# Patient Record
Sex: Female | Born: 1950
Health system: Southern US, Community
[De-identification: ages and names within clinical notes are randomized; demographics above are authoritative.]

## PROBLEM LIST (undated history)

## (undated) DIAGNOSIS — Z803 Family history of malignant neoplasm of breast: Secondary | ICD-10-CM

## (undated) DIAGNOSIS — Z8341 Family history of multiple endocrine neoplasia [MEN] syndrome: Secondary | ICD-10-CM

## (undated) HISTORY — DX: Family history of malignant neoplasm of breast: Z80.3

## (undated) HISTORY — PX: ABDOMINAL HYSTERECTOMY: SHX81

## (undated) HISTORY — DX: Family history of multiple endocrine neoplasia (MEN) syndrome: Z83.41

## (undated) HISTORY — PX: APPENDECTOMY: SHX54

## (undated) HISTORY — PX: TONSILLECTOMY: SUR1361

## (undated) HISTORY — PX: ABDOMINAL SURGERY: SHX537

---

## 1997-11-27 ENCOUNTER — Ambulatory Visit (HOSPITAL_COMMUNITY): Admission: RE | Admit: 1997-11-27 | Discharge: 1997-11-27 | Payer: Self-pay | Admitting: Internal Medicine

## 1999-04-08 ENCOUNTER — Inpatient Hospital Stay (HOSPITAL_COMMUNITY): Admission: RE | Admit: 1999-04-08 | Discharge: 1999-04-11 | Payer: Self-pay | Admitting: Obstetrics and Gynecology

## 1999-04-08 ENCOUNTER — Encounter (INDEPENDENT_AMBULATORY_CARE_PROVIDER_SITE_OTHER): Payer: Self-pay | Admitting: Specialist

## 1999-04-25 ENCOUNTER — Inpatient Hospital Stay (HOSPITAL_COMMUNITY): Admission: AD | Admit: 1999-04-25 | Discharge: 1999-04-25 | Payer: Self-pay | Admitting: Obstetrics and Gynecology

## 2000-05-05 ENCOUNTER — Other Ambulatory Visit: Admission: RE | Admit: 2000-05-05 | Discharge: 2000-05-05 | Payer: Self-pay | Admitting: Obstetrics and Gynecology

## 2000-05-28 ENCOUNTER — Encounter (INDEPENDENT_AMBULATORY_CARE_PROVIDER_SITE_OTHER): Payer: Self-pay | Admitting: *Deleted

## 2000-05-28 LAB — CONVERTED CEMR LAB

## 2000-12-22 ENCOUNTER — Encounter: Admission: RE | Admit: 2000-12-22 | Discharge: 2000-12-22 | Payer: Self-pay | Admitting: Family Medicine

## 2001-01-18 ENCOUNTER — Encounter: Payer: Self-pay | Admitting: Vascular Surgery

## 2001-01-19 ENCOUNTER — Encounter: Admission: RE | Admit: 2001-01-19 | Discharge: 2001-01-19 | Payer: Self-pay | Admitting: Obstetrics and Gynecology

## 2001-01-19 ENCOUNTER — Encounter: Payer: Self-pay | Admitting: Obstetrics and Gynecology

## 2001-01-20 ENCOUNTER — Ambulatory Visit (HOSPITAL_COMMUNITY): Admission: RE | Admit: 2001-01-20 | Discharge: 2001-01-20 | Payer: Self-pay | Admitting: Vascular Surgery

## 2001-01-20 ENCOUNTER — Encounter (INDEPENDENT_AMBULATORY_CARE_PROVIDER_SITE_OTHER): Payer: Self-pay | Admitting: *Deleted

## 2001-02-07 ENCOUNTER — Encounter: Admission: RE | Admit: 2001-02-07 | Discharge: 2001-02-07 | Payer: Self-pay | Admitting: Obstetrics and Gynecology

## 2001-02-07 ENCOUNTER — Encounter: Payer: Self-pay | Admitting: Obstetrics and Gynecology

## 2001-06-22 ENCOUNTER — Other Ambulatory Visit: Admission: RE | Admit: 2001-06-22 | Discharge: 2001-06-22 | Payer: Self-pay | Admitting: Obstetrics and Gynecology

## 2002-02-13 ENCOUNTER — Encounter: Payer: Self-pay | Admitting: Obstetrics and Gynecology

## 2002-02-13 ENCOUNTER — Encounter: Admission: RE | Admit: 2002-02-13 | Discharge: 2002-02-13 | Payer: Self-pay | Admitting: Obstetrics and Gynecology

## 2002-07-19 ENCOUNTER — Other Ambulatory Visit: Admission: RE | Admit: 2002-07-19 | Discharge: 2002-07-19 | Payer: Self-pay | Admitting: Obstetrics and Gynecology

## 2003-02-15 ENCOUNTER — Encounter: Admission: RE | Admit: 2003-02-15 | Discharge: 2003-02-15 | Payer: Self-pay | Admitting: Obstetrics and Gynecology

## 2003-02-15 ENCOUNTER — Encounter: Payer: Self-pay | Admitting: Obstetrics and Gynecology

## 2003-06-19 ENCOUNTER — Ambulatory Visit (HOSPITAL_BASED_OUTPATIENT_CLINIC_OR_DEPARTMENT_OTHER): Admission: RE | Admit: 2003-06-19 | Discharge: 2003-06-19 | Payer: Self-pay | Admitting: Orthopedic Surgery

## 2003-06-19 ENCOUNTER — Ambulatory Visit (HOSPITAL_COMMUNITY): Admission: RE | Admit: 2003-06-19 | Discharge: 2003-06-19 | Payer: Self-pay | Admitting: Orthopedic Surgery

## 2004-03-04 ENCOUNTER — Encounter: Admission: RE | Admit: 2004-03-04 | Discharge: 2004-03-04 | Payer: Self-pay | Admitting: Obstetrics and Gynecology

## 2004-04-04 ENCOUNTER — Ambulatory Visit (HOSPITAL_COMMUNITY): Admission: RE | Admit: 2004-04-04 | Discharge: 2004-04-04 | Payer: Self-pay | Admitting: Gastroenterology

## 2005-03-06 ENCOUNTER — Encounter: Admission: RE | Admit: 2005-03-06 | Discharge: 2005-03-06 | Payer: Self-pay | Admitting: Obstetrics and Gynecology

## 2006-03-08 ENCOUNTER — Encounter: Admission: RE | Admit: 2006-03-08 | Discharge: 2006-03-08 | Payer: Self-pay | Admitting: Obstetrics and Gynecology

## 2006-03-11 ENCOUNTER — Encounter: Admission: RE | Admit: 2006-03-11 | Discharge: 2006-03-11 | Payer: Self-pay | Admitting: Obstetrics and Gynecology

## 2006-06-25 ENCOUNTER — Encounter (INDEPENDENT_AMBULATORY_CARE_PROVIDER_SITE_OTHER): Payer: Self-pay | Admitting: *Deleted

## 2006-09-29 ENCOUNTER — Encounter: Admission: RE | Admit: 2006-09-29 | Discharge: 2006-09-29 | Payer: Self-pay | Admitting: Internal Medicine

## 2006-12-15 ENCOUNTER — Encounter: Admission: RE | Admit: 2006-12-15 | Discharge: 2006-12-15 | Payer: Self-pay | Admitting: Internal Medicine

## 2007-03-11 ENCOUNTER — Encounter: Admission: RE | Admit: 2007-03-11 | Discharge: 2007-03-11 | Payer: Self-pay | Admitting: Obstetrics and Gynecology

## 2007-09-23 ENCOUNTER — Emergency Department (HOSPITAL_COMMUNITY): Admission: EM | Admit: 2007-09-23 | Discharge: 2007-09-23 | Payer: Self-pay | Admitting: Emergency Medicine

## 2007-12-28 ENCOUNTER — Emergency Department (HOSPITAL_COMMUNITY): Admission: EM | Admit: 2007-12-28 | Discharge: 2007-12-28 | Payer: Self-pay | Admitting: Family Medicine

## 2008-03-12 ENCOUNTER — Encounter: Admission: RE | Admit: 2008-03-12 | Discharge: 2008-03-12 | Payer: Self-pay | Admitting: Obstetrics and Gynecology

## 2008-11-23 ENCOUNTER — Emergency Department (HOSPITAL_COMMUNITY): Admission: EM | Admit: 2008-11-23 | Discharge: 2008-11-23 | Payer: Self-pay | Admitting: Emergency Medicine

## 2009-03-13 ENCOUNTER — Encounter: Admission: RE | Admit: 2009-03-13 | Discharge: 2009-03-13 | Payer: Self-pay | Admitting: Obstetrics and Gynecology

## 2010-03-14 ENCOUNTER — Encounter: Admission: RE | Admit: 2010-03-14 | Discharge: 2010-03-14 | Payer: Self-pay | Admitting: Obstetrics and Gynecology

## 2010-08-03 LAB — BASIC METABOLIC PANEL
BUN: 9 mg/dL (ref 6–23)
Calcium: 9.2 mg/dL (ref 8.4–10.5)
Creatinine, Ser: 0.89 mg/dL (ref 0.4–1.2)
GFR calc Af Amer: >60 mL/min (ref >60)

## 2010-08-03 LAB — CBC
MCHC: 34.5 g/dL (ref 30.0–36.0)
Platelets: 232 10*3/uL (ref 150–400)
RBC: 4.47 MIL/uL (ref 3.87–5.11)
WBC: 8.8 10*3/uL (ref 4.0–10.5)

## 2010-08-03 LAB — DIFFERENTIAL
Basophils Relative: 1 % (ref 0–1)
Lymphs Abs: 1.7 10*3/uL (ref 0.7–4.0)
Monocytes Relative: 4 % (ref 3–12)
Neutro Abs: 6.6 10*3/uL (ref 1.7–7.7)
Neutrophils Relative %: 75 % (ref 43–77)

## 2010-09-12 NOTE — Op Note (Signed)
Lori Ruiz, Lori Ruiz               ACCOUNT NO.:  [id number]   MEDICAL RECORD NO.:  [id number]          PATIENT TYPE:  AMB   LOCATION:  ENDO                         FACILITY:  [facility]   PHYSICIAN:  Anselmo Rod, M.D.  DATE OF BIRTH:  [birth year]   DATE OF PROCEDURE:  04/04/2004  DATE OF DISCHARGE:                                 OPERATIVE REPORT   PROCEDURE:  Screening colonoscopy.   ENDOSCOPIST:  Anselmo Rod, M.D.   INSTRUMENT USED:  Olympus video colonoscope.   INDICATIONS FOR PROCEDURE:  A 60 year old white female with a history of  occasional constipation, undergoing screening colonoscopy to rule out  colonic polyps, masses, etc.   PREPROCEDURE PREPARATION:  Informed consent was procured from the patient.  The patient was fasted for eight hours prior to the procedure and prepped  with a bottle of magnesium citrate and a gallon of GoLYTELY the night prior  to the procedure.   PREPROCEDURE PHYSICAL EXAMINATION:  VITAL SIGNS:  Stable.  NECK:  Supple.  CHEST:  Clear to auscultation.  S1 and S2 regular.  ABDOMEN:  Soft with normal bowel sounds.   DESCRIPTION OF PROCEDURE:  The patient was placed in the left lateral  decubitus position and sedated with 60 mg of Demerol and 8 mg of Versed in  slow incremental doses.  Once the patient was adequately sedated and  maintained on low flow oxygen and continuous cardiac monitoring, the Olympus  video colonoscope was advanced from the rectum to the cecum.  The  appendiceal orifice and ileocecal valve were clearly visualized and  photographed.  No masses, polyps, erosions, ulcerations, etc., were seen.  A  small isolated diverticulum was seen in the proximal right colon.  No other  abnormalities were identified.  Retroflexion in the rectum revealed no  abnormalities as well.  The patient tolerated the procedure well without  immediate complications.   IMPRESSION:  Small diverticulum seen in the proximal right colon, otherwise  normal colonoscopy up to the cecum.   RECOMMENDATIONS:  1.  Continue a high fiber diet with liberal fluid intake.  2.  Repeat CRC screening in the next 10 years unless the patient develops      any abnormal symptoms in the interim.  3.  Outpatient follow-up as need arises in the future.      Jyot   JNM/MEDQ  D:  04/04/2004  T:  04/05/2004  Job:  [id number]   cc:   Loraine Leriche A. Waynard Edwards, M.D.  [address]  [place]  [place] [address]  Fax: [phone]

## 2010-09-12 NOTE — Op Note (Signed)
[facility]  Patient:    Lori Ruiz, DIVER Visit Number: [id number] MRN: [id number]          Service Type: DSU Location: [facility] 2899 19 Attending Physician:  Bennye Alm Dictated by:   Di Kindle Edilia Bo, M.D. Proc. 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  OPERATION PERFORMED:  Segmental excision of painful varicose veins of bilateral lower extremities.  SURGEON:  Di Kindle. Edilia Bo, M.D.  ASSISTANTTollie Pizza. Thomasena Edis, P.A.  ANESTHESIA:  General.  DESCRIPTION OF PROCEDURE:  The patient was taken to the operating room after the patient had the varicose veins of both lower extremities marked with her standing.  The legs were then prepped and draped in the usual sterile fashion after the patient has received a general anesthetic.  Through six small 5 mm incisions along the lateral aspect of the right leg and thigh, the veins were teased out and bluntly excised using a mosquito.  Pressure was held for hemostasis.  These incisions were closed with a 4-0 Vicryl suture.  Half-inch Steri-Strips were applied and then a pressure dressing was applied.  On the left side I used four 5 mm longitudinal incisions over the marked varicosities and then using a stab avulsion technique, the veins were teased into the wound and then bluntly avulsed and pressure held for hemostasis.  These incisions were closed with interrupted 4-0 Vicryls.  Half-inch Steri-Strips were applied and a sterile dressing was applied.  The patient tolerated the procedure well and was transferred to the recovery room in satisfactory condition.  All needle and sponge counts were correct. Dictated by:   Di Kindle Edilia Bo, M.D. Attending  Physician:  Bennye Alm DD:  01/20/01 TD:  01/20/01 Job: [id number]

## 2010-09-12 NOTE — Op Note (Signed)
Lori Ruiz, Lori Ruiz                         ACCOUNT NO.:  [id number]   MEDICAL RECORD NO.:  [id number]                   PATIENT TYPE:  AMB   LOCATION:  DSC                                  FACILITY:  [facility]   PHYSICIAN:  Leonides Grills, M.D.                  DATE OF BIRTH:  [birth year]   DATE OF PROCEDURE:  06/19/2003  DATE OF DISCHARGE:                                 OPERATIVE REPORT   PREOPERATIVE DIAGNOSIS:  Bilateral hallux valgus.   POSTOPERATIVE DIAGNOSIS:  Bilateral hallux valgus.   OPERATION PERFORMED:  1. Bilateral chevron bunionectomies.  2. Stress x-rays bilateral feet.   SURGEON:  Leonides Grills, M.D.   ASSISTANT:  Lianne Cure, P.A.   ANESTHESIA:  General endotracheal tube.   ESTIMATED BLOOD LOSS:  Minimal.   TOURNIQUET TIME:  Approximately half hour per side.   COMPLICATIONS:  None.   DISPOSITION:  Stable to PR.   INDICATIONS FOR PROCEDURE:  The patient is a 60 year old very pleasant  female who presents today with bilateral painful hallux, valgus deformities  that were resistant to conservative management.  The patient  has consented  for the above procedure.  All risks which include infection, neurovascular  injury, nonunion, malunion, hardware irritation, hardware failure,  persistent pain, worsening pain, stiffness, arthritis and possible  recurrence of hallux varus were all explained, questions were encouraged and  answered.   DESCRIPTION OF PROCEDURE:  The patient was brought to the operating room and  placed in supine position after adequate general endotracheal tube  anesthesia was administered as well as Ancef 1 g IV piggyback.  Bilateral  lower extremities were prepped and draped in sterile manner over a  proximally placed thigh tourniquet.  We started at the left side.  The limb  was gravity exsanguinated and the tourniquet was elevated to 290 mmHg.  A  longitudinal incision over the medial aspect of the midline over the great  toe MTP joint  was then made.  Dissection was then carried down through skin  and neurovascular structures were identified and protected both superiorly  and inferiorly.  L-shaped capsulotomy was then made.  Simple bunionectomy  was then performed with sagittal saw.  Lateral capsule was then released  from within the joint.  The center of the head was then identified.  Chevron  osteotomy was then created with the sagittal saw protecting soft tissues  both superiorly and inferiorly.  The head was then translated approximately  3 mm laterally.  This was then fixed with a 2.5 mm fully threaded cortical  screw using a 1.5 mm drill hole respectively.  This was countersunk as well.  This had excellent fixation.  Redundant bone medially was then trimmed off  with a sagittal saw, Rocky Link Johnson's ridge was rounded off with a rongeur.  The joint and wound was copiously irrigated with normal saline.  Capsule was  then advanced superiorly and proximally  with the toe held in reduced  position fixing it with 2-0 Vicryl suture.  At this point stress x-rays were  obtained and showed excellent placement of fixation as well as correction  and location of the sesamoids with range of motion as well.  The same exact  procedure was performed once the tourniquet was deflated on the left side  and hemostasis was obtained on the left.  The same exact procedure was  performed on the right side as well.  At the end of the procedure, the  wounds were then copiously irrigated with normal saline.  The wounds were  closed with 4-0 nylon suture.  Sterile dressing was applied.  Roger Mann  dressings were applied.  Hard sole shoes were applied.  The patient was  stable to the PR.                                               Leonides Grills, M.D.    PB/MEDQ  D:  06/19/2003  T:  06/19/2003  Job:  [id number]

## 2010-09-12 NOTE — Op Note (Signed)
[facility]  Patient:    Lori Ruiz                       MRN: [id number] Proc. Date: 04/08/99 Adm. Date:  [id number] Attending:  Trevor Iha                           Operative Report  PREOPERATIVE DIAGNOSIS:       Generous stress urinary incontinence and left-sided                               pelvic pain.  POSTOPERATIVE DIAGNOSIS:      Generous stress urinary incontinence and left-sided                               pelvic pain.  OPERATION:                    TAH/BSO and Birch retropubic urethropexy.  SURGEON:                      Trevor Iha, M.D.  ASSISTANT:                    Duke Salvia. Marcelle Overlie, M.D.  ANESTHESIA:                   General endotracheal.  INDICATIONS:                  The patient is a 60 year old gravida 4, para 3, abortion 1, with continued stress urinary incontinence.  No improvement with conservative measures including ________ exercises, hormone replacement therapy  (socially debilitating) and presents for definitive surgical correction of this. She furthermore has a history of left-sided ectopic pregnancy and scar tissue, nd has left-sided discomfort which requires nonsteroidal anti-inflammatory medications.  She desires hysterectomy because of the persistent left-sided pain. Risks and benefits were discussed at length including to but not limited to risks of infection, bleeding, damage to gallbladder, ureters, inability to alleviate,  urinary incontinence, or over correction with prolonged catheter wear.  The patient gives her informed consent.  See History and Physical for further details.  FINDINGS AT TIME OF SURGERY:  Left-sided adhesive disease, normal appearing ovaries and uterus.  The appendix is retrocecal and not visualized.  DESCRIPTION OF PROCEDURE:     After adequate anesthesia, the patient was placed in the supine position in a frog-leg position.  She was sterilely prepped  and draped. A 30 cc Foley catheter was placed transurethrally.  After sterile prep and drape, a Pfannenstiel skin incision was made two fingerbreadths above the pubic symphysis, and taken down sharply in the midline, and incised transversely, then superiorly and inferiorly at the bellies at the rectus muscles which were separated sharply in the midline.  The peritoneum was entered sharply.  An OConnor-OSullivan retractor was placed.  The bowel was packed cephalad.  Lysis of adhesions from the bowel,  omentum and pelvic side wall to the left tube and ovary were sharply dissected ith Metzenbaum scissors.  Kelly clamps were placed across the utero-ovarian ligaments bilaterally.  The round ligaments were identified and suture ligated with  0 Monocryl.  The broad ligament was opened bilaterally.  The bladder was dissected off the anterior cervix.  A small  window at the broad ligament was made and a Heaney clamp was placed across the infundibulopelvic ligament.  Care was taken o avoid the ureter and place it close to the ovary.  This was done bilaterally. he clamp was cut and tied, and double tied with 0 Monocryl suture.  The uterovesical and cervix were then skeletonized and Heaney clamps were placed across the uterine vesicle which showed the level of the internal os.  The bladder was dissected off the anterior cervix and straight Heaney clamps were placed across the cardinal ligament bilaterally, and the clamp cut and tied down to the uterosacral ligaments, where curved Heaney clamps were placed across the uterosacral ligaments bilaterally, and clamped, cut and tied again with 0 Monocryl.                                At this time, the vagina was entered.  The uterus was incised and completely removed with the cervix intact using the Satinsky scissors. The angles of the vagina were closed with 0 Monocryl angled sutures with good approximation.  The vagina was closed in a  horizontal fashion with figure-of-eights of 0 Monocryl with good approximation and good hemostasis.  Irrigation was applied at this time.  Hemostasis was noted to be adequate.  The uterosacral ligaments ere then plicated in the midline.  A small amount of peritoneal bleeders were cauterized with Bovie cautery after hemostasis was achieved.  The packing was removed.  The retractor was then removed and began with the [facility] retropubic urethropexy.                                The OConnor-OSullivan retractor was used to reflect the rectus muscles laterally.  The obturator was left and a hand was placed in he vagina.  The Foley catheter was pulled towards the operators hand.  The angle of the bladder neck was noted with the operators fingertips.  A suture of 0 Ethibond was placed 1 cm laterally and 1 cm inferiorly to the right and left angle of the bladder neck.  I place a pulley suture, and the bladder neck was then elevated o the pubic symphysis where the suture was now placed through the Coopers ligament, directly above, but with good support noted.  A second suture was placed 1 cm lateral to that and approximately 2 cm from the ___ angle.  This was done bilaterally with a pulley stitch through the muscularis layer, being careful not to enter the vagina and placed through Coopers ligament.  This was done bilaterally with good support noted.                                At this time, the sutures were tied with good support of the UV angle noted.  The operators hand was introduced from the vagina.  The  bladder was filled with sterile milk, approximately 400 cc.  Examination of the  areas around the suture showed no leaking of sterile milk.  At this time, Bonnano suprapubic catheter was placed with direct visualization with entering into the  bladder.  This was sutured together to the skin.  The peritoneum had been previously closed with 0 Monocryl suture.  The fascia was  then closed in a 0 Vicryl running fashion.  Irrigation was again  applied after adequate hemostasis.  The kin was stapled and Steri-Strips applied.                                The patient tolerated the procedure well and stable  on transfer to the recovery room.  Sponge and instrument count was correct x 3.  The estimated blood loss for the procedure was 150 cc. DD:  04/08/99 TD:  04/09/99 Job: [id number]

## 2011-02-03 ENCOUNTER — Other Ambulatory Visit: Payer: Self-pay | Admitting: Obstetrics and Gynecology

## 2011-02-03 DIAGNOSIS — Z1231 Encounter for screening mammogram for malignant neoplasm of breast: Secondary | ICD-10-CM

## 2011-03-16 ENCOUNTER — Ambulatory Visit
Admission: RE | Admit: 2011-03-16 | Discharge: 2011-03-16 | Disposition: A | Discharge status: Home / Self Care | Payer: 59 | Source: Ambulatory Visit | Attending: Obstetrics and Gynecology | Admitting: Obstetrics and Gynecology

## 2011-03-16 DIAGNOSIS — Z1231 Encounter for screening mammogram for malignant neoplasm of breast: Secondary | ICD-10-CM

## 2011-12-16 ENCOUNTER — Other Ambulatory Visit: Payer: Self-pay | Admitting: Obstetrics and Gynecology

## 2011-12-16 DIAGNOSIS — N63 Unspecified lump in unspecified breast: Secondary | ICD-10-CM

## 2011-12-16 DIAGNOSIS — Z1231 Encounter for screening mammogram for malignant neoplasm of breast: Secondary | ICD-10-CM

## 2012-03-01 ENCOUNTER — Other Ambulatory Visit: Payer: Self-pay | Admitting: Obstetrics and Gynecology

## 2012-03-01 DIAGNOSIS — N63 Unspecified lump in unspecified breast: Secondary | ICD-10-CM

## 2012-03-16 ENCOUNTER — Ambulatory Visit
Admission: RE | Admit: 2012-03-16 | Discharge: 2012-03-16 | Disposition: A | Discharge status: Home / Self Care | Payer: 59 | Source: Ambulatory Visit | Attending: Obstetrics and Gynecology | Admitting: Obstetrics and Gynecology

## 2012-03-16 ENCOUNTER — Ambulatory Visit: Payer: 59

## 2012-03-16 DIAGNOSIS — N63 Unspecified lump in unspecified breast: Secondary | ICD-10-CM

## 2013-02-14 ENCOUNTER — Other Ambulatory Visit: Payer: Self-pay

## 2013-02-14 DIAGNOSIS — Z1231 Encounter for screening mammogram for malignant neoplasm of breast: Secondary | ICD-10-CM

## 2013-03-17 ENCOUNTER — Ambulatory Visit
Admission: RE | Admit: 2013-03-17 | Discharge: 2013-03-17 | Disposition: A | Discharge status: Home / Self Care | Payer: 59 | Source: Ambulatory Visit

## 2013-03-17 DIAGNOSIS — Z1231 Encounter for screening mammogram for malignant neoplasm of breast: Secondary | ICD-10-CM

## 2013-03-21 ENCOUNTER — Other Ambulatory Visit: Payer: Self-pay | Admitting: Obstetrics and Gynecology

## 2013-03-21 DIAGNOSIS — R928 Other abnormal and inconclusive findings on diagnostic imaging of breast: Secondary | ICD-10-CM

## 2013-03-28 ENCOUNTER — Ambulatory Visit
Admission: RE | Admit: 2013-03-28 | Discharge: 2013-03-28 | Disposition: A | Discharge status: Home / Self Care | Payer: 59 | Source: Ambulatory Visit | Attending: Obstetrics and Gynecology | Admitting: Obstetrics and Gynecology

## 2013-03-28 DIAGNOSIS — R928 Other abnormal and inconclusive findings on diagnostic imaging of breast: Secondary | ICD-10-CM

## 2013-06-27 ENCOUNTER — Ambulatory Visit: Payer: 59 | Attending: Orthopedic Surgery

## 2013-06-27 DIAGNOSIS — M25559 Pain in unspecified hip: Secondary | ICD-10-CM | POA: Insufficient documentation

## 2013-06-27 DIAGNOSIS — M25659 Stiffness of unspecified hip, not elsewhere classified: Secondary | ICD-10-CM | POA: Insufficient documentation

## 2013-06-27 DIAGNOSIS — IMO0001 Reserved for inherently not codable concepts without codable children: Secondary | ICD-10-CM | POA: Insufficient documentation

## 2013-06-27 DIAGNOSIS — R5381 Other malaise: Secondary | ICD-10-CM | POA: Insufficient documentation

## 2013-07-06 ENCOUNTER — Ambulatory Visit: Payer: 59 | Admitting: Rehabilitation

## 2013-07-10 ENCOUNTER — Ambulatory Visit: Payer: 59 | Admitting: Rehabilitation

## 2013-07-12 ENCOUNTER — Encounter: Payer: 59 | Admitting: Physical Therapy

## 2013-07-17 ENCOUNTER — Encounter: Payer: 59 | Admitting: Physical Therapy

## 2013-07-19 ENCOUNTER — Encounter: Payer: 59 | Admitting: Physical Therapy

## 2013-08-18 ENCOUNTER — Other Ambulatory Visit: Payer: Self-pay | Admitting: Occupational Medicine

## 2013-08-18 ENCOUNTER — Ambulatory Visit: Payer: Self-pay

## 2013-08-18 DIAGNOSIS — R52 Pain, unspecified: Secondary | ICD-10-CM

## 2014-02-20 ENCOUNTER — Other Ambulatory Visit: Payer: Self-pay

## 2014-02-20 DIAGNOSIS — Z1231 Encounter for screening mammogram for malignant neoplasm of breast: Secondary | ICD-10-CM

## 2014-03-19 ENCOUNTER — Ambulatory Visit
Admission: RE | Admit: 2014-03-19 | Discharge: 2014-03-19 | Disposition: A | Discharge status: Home / Self Care | Payer: 59 | Source: Ambulatory Visit

## 2014-03-19 DIAGNOSIS — Z1231 Encounter for screening mammogram for malignant neoplasm of breast: Secondary | ICD-10-CM

## 2015-02-11 ENCOUNTER — Other Ambulatory Visit: Payer: Self-pay

## 2015-02-11 DIAGNOSIS — Z1231 Encounter for screening mammogram for malignant neoplasm of breast: Secondary | ICD-10-CM

## 2015-03-25 ENCOUNTER — Ambulatory Visit
Admission: RE | Admit: 2015-03-25 | Discharge: 2015-03-25 | Disposition: A | Discharge status: Home / Self Care | Payer: 59 | Source: Ambulatory Visit

## 2015-03-25 DIAGNOSIS — Z1231 Encounter for screening mammogram for malignant neoplasm of breast: Secondary | ICD-10-CM

## 2015-03-26 ENCOUNTER — Other Ambulatory Visit: Payer: Self-pay | Admitting: Obstetrics and Gynecology

## 2015-03-26 DIAGNOSIS — R928 Other abnormal and inconclusive findings on diagnostic imaging of breast: Secondary | ICD-10-CM

## 2015-03-27 ENCOUNTER — Ambulatory Visit
Admission: RE | Admit: 2015-03-27 | Discharge: 2015-03-27 | Disposition: A | Discharge status: Home / Self Care | Payer: 59 | Source: Ambulatory Visit | Attending: Obstetrics and Gynecology | Admitting: Obstetrics and Gynecology

## 2015-03-27 DIAGNOSIS — R928 Other abnormal and inconclusive findings on diagnostic imaging of breast: Secondary | ICD-10-CM

## 2015-05-15 ENCOUNTER — Emergency Department (HOSPITAL_COMMUNITY): Payer: 59

## 2015-05-15 ENCOUNTER — Encounter (HOSPITAL_COMMUNITY): Payer: Self-pay | Admitting: *Deleted

## 2015-05-15 ENCOUNTER — Emergency Department (HOSPITAL_COMMUNITY)
Admission: EM | Admit: 2015-05-15 | Discharge: 2015-05-15 | Disposition: A | Discharge status: Other Acute Inpatient Hospital | Payer: 59 | Source: Home / Self Care | Attending: Family Medicine | Admitting: Family Medicine

## 2015-05-15 DIAGNOSIS — Z79899 Other long term (current) drug therapy: Secondary | ICD-10-CM | POA: Diagnosis not present

## 2015-05-15 DIAGNOSIS — Z7982 Long term (current) use of aspirin: Secondary | ICD-10-CM | POA: Diagnosis not present

## 2015-05-15 DIAGNOSIS — R51 Headache: Secondary | ICD-10-CM | POA: Diagnosis not present

## 2015-05-15 DIAGNOSIS — R42 Dizziness and giddiness: Secondary | ICD-10-CM | POA: Insufficient documentation

## 2015-05-15 DIAGNOSIS — H5702 Anisocoria: Secondary | ICD-10-CM | POA: Diagnosis not present

## 2015-05-15 DIAGNOSIS — R29818 Other symptoms and signs involving the nervous system: Secondary | ICD-10-CM | POA: Diagnosis not present

## 2015-05-15 NOTE — ED Notes (Addendum)
Spoke with Dr. Roxanne Mins regarding needing a head CT  CT notified

## 2015-05-15 NOTE — ED Provider Notes (Addendum)
CSN: [id number]     Arrival date & time 05/15/15  1923 History   First MD Initiated Contact with Patient 05/15/15 2006     Chief Complaint  Patient presents with  . Dizziness   (Consider location/radiation/quality/duration/timing/severity/associated sxs/prior Treatment) Patient is a 65 y.o. female presenting with dizziness. The history is provided by the patient and the spouse.  Dizziness Quality:  Room spinning Severity:  Moderate Onset quality:  Sudden Duration:  18 hours Progression:  Improving Chronicity:  New Context comment:  Onset awoke from sleep. Relieved by:  Being still Associated symptoms: headaches   Associated symptoms: no chest pain and no palpitations     History reviewed. No pertinent past medical history. Past Surgical History  Procedure Laterality Date  . Abdominal hysterectomy    . Tonsillectomy    . Appendectomy    . Abdominal surgery     History reviewed. No pertinent family history. Social History  Substance Use Topics  . Smoking status: Never Smoker   . Smokeless tobacco: None  . Alcohol Use: No   OB History    No data available     Review of Systems  Eyes: Negative.   Cardiovascular: Negative for chest pain, palpitations and leg swelling.  Neurological: Positive for dizziness and headaches.  All other systems reviewed and are negative.   Allergies  Review of patient's allergies indicates no known allergies.  Home Medications   Prior to Admission medications   Medication Sig Start Date End Date Taking? Authorizing Provider  DICLOFENAC PO Take by mouth.   Yes Historical Provider, MD  ESTRADIOL PO Take by mouth.   Yes Historical Provider, MD  Ipratropium Bromide (ATROVENT IN) Inhale into the lungs.   Yes Historical Provider, MD   Meds Ordered and Administered this Visit  Medications - No data to display  BP 167/98 mmHg  Pulse 73  Temp(Src) 97.9 F (36.6 C) (Oral)  SpO2 97% No data found.   Physical Exam  Constitutional: She  is oriented to person, place, and time. She appears well-developed and well-nourished.  Eyes: Conjunctivae and EOM are normal. Right eye exhibits normal extraocular motion. Left eye exhibits normal extraocular motion and no nystagmus. Pupils are unequal.    Neck: Normal range of motion. Neck supple.  Cardiovascular: Normal heart sounds and intact distal pulses.   Pulmonary/Chest: Effort normal and breath sounds normal.  Musculoskeletal: Normal range of motion.  Neurological: She is alert and oriented to person, place, and time. A cranial nerve deficit is present. Coordination normal.  Skin: Skin is warm and dry.  Nursing note and vitals reviewed.   ED Course  Procedures (including critical care time)  Labs Review Labs Reviewed - No data to display  Imaging Review No results found.   Visual Acuity Review  Right Eye Distance:   Left Eye Distance:   Bilateral Distance:    Right Eye Near:   Left Eye Near:    Bilateral Near:         MDM   1. Acute focal neurological deficit    Sent for neuro eval of dizziness, hbp, l>r pupils, headache sudden onset around 1am today.    Billy Fischer, MD 05/15/15 2028  Billy Fischer, MD 05/15/15 2029

## 2015-05-15 NOTE — ED Notes (Signed)
Became   Dizzy    Last  Night          Headache           Vertigo         l    Eye  Is  Dilated           Contacts    Are        Still  In    Hit     r  Side  Head        4  Days    Did  Not  Black  Out  She  Is   Awake   And  Alert       At  This  Time              Hand  Grips  Are

## 2015-05-15 NOTE — ED Notes (Signed)
Patient presents stating she got up this morning with a headache and felt dizzy.  She took 2 Ibuprofen at that time and noticed her left pupil was dilated  Stated she hit herself on the right side of her head last Saturday with a piece of wood

## 2015-05-16 ENCOUNTER — Emergency Department (HOSPITAL_COMMUNITY)
Admission: EM | Admit: 2015-05-16 | Discharge: 2015-05-16 | Disposition: A | Discharge status: Home / Self Care | Payer: 59 | Attending: Emergency Medicine | Admitting: Emergency Medicine

## 2015-05-16 DIAGNOSIS — H5702 Anisocoria: Secondary | ICD-10-CM

## 2015-05-16 DIAGNOSIS — R42 Dizziness and giddiness: Secondary | ICD-10-CM

## 2015-05-16 NOTE — ED Notes (Signed)
Pt left with all her belongings and ambulated out of the treatment area.  
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
## 2015-05-16 NOTE — Discharge Instructions (Signed)
Vertigo Vertigo means that you feel like you are moving when you are not. Vertigo can also make you feel like things around you are moving when they are not. This feeling can come and go at any time. Vertigo often goes away on its own. HOME CARE  Avoid making fast movements.  Avoid driving.  Avoid using heavy machinery.  Avoid doing any task or activity that might cause danger to you or other people if you would have a vertigo attack while you are doing it.  Sit down right away if you feel dizzy or have trouble with your balance.  Take over-the-counter and prescription medicines only as told by your doctor.  Follow instructions from your doctor about which positions or movements you should avoid.  Drink enough fluid to keep your pee (urine) clear or pale yellow.  Keep all follow-up visits as told by your doctor. This is important. GET HELP IF:  Medicine does not help your vertigo.  You have a fever.  Your problems get worse or you have new symptoms.  Your family or friends see changes in your behavior.  You feel sick to your stomach (nauseous) or you throw up (vomit).  You have a "pins and needles" feeling or you are numb in part of your body. GET HELP RIGHT AWAY IF:  You have trouble moving or talking.  You are always dizzy.  You pass out (faint).  You get very bad headaches.  You feel weak or have trouble using your hands, arms, or legs.  You have changes in your hearing.  You have changes in your seeing (vision).  You get a stiff neck.  Bright light starts to bother you.   This information is not intended to replace advice given to you by your health care provider. Make sure you discuss any questions you have with your health care provider.   Document Released: 01/21/2008 Document Revised: 01/02/2015 Document Reviewed: 08/06/2014 Elsevier Interactive Patient Education 2016 [employer]  

## 2015-05-28 MED FILL — DICLOFENAC SODIUM 1% GEL: 1 | 17 days supply | Qty: 100 | Fill #3

## 2015-07-08 ENCOUNTER — Telehealth: Payer: 59 | Admitting: Family

## 2015-07-08 DIAGNOSIS — R059 Cough, unspecified: Secondary | ICD-10-CM

## 2015-07-08 DIAGNOSIS — R05 Cough: Secondary | ICD-10-CM

## 2015-07-08 MED ORDER — BENZONATATE 100 MG PO CAPS: 100.0000 mg | ORAL_CAPSULE | Freq: Three times a day (TID) | ORAL | Status: AC | PRN

## 2015-07-08 NOTE — Progress Notes (Signed)
We are sorry that you are not feeling well.  Here is how we plan to help!  Based on what you have shared with me it looks like you have upper respiratory tract inflammation that has resulted in a significant cough.  Inflammation and infection in the upper respiratory tract is commonly called bronchitis and has four common causes:  Allergies, Viral Infections, Acid Reflux and Bacterial Infections.  Allergies, viruses and acid reflux are treated by controlling symptoms or eliminating the cause. An example might be a cough caused by taking certain blood pressure medications. You stop the cough by changing the medication. Another example might be a cough caused by acid reflux. Controlling the reflux helps control the cough.  Based on your presentation I believe you most likely have A cough due to a virus.  This is called viral bronchitis and is best treated by rest, plenty of fluids and control of the cough.  You may use Ibuprofen or Tylenol as directed to help your symptoms.    In addition you may use A non-prescription cough medication called Mucinex DM: take 2 tablets every 12 hours. and A prescription cough medication called Tessalon Perles 100mg. You may take 1-2 capsules every 8 hours as needed for your cough.    HOME CARE . Only take medications as instructed by your medical team. . Complete the entire course of an antibiotic. . Drink plenty of fluids and get plenty of rest. . Avoid close contacts especially the very young and the elderly . Cover your mouth if you cough or cough into your sleeve. . Always remember to wash your hands . A steam or ultrasonic humidifier can help congestion.    GET HELP RIGHT AWAY IF: . You develop worsening fever. . You become short of breath . You cough up blood. . Your symptoms persist after you have completed your treatment plan MAKE SURE YOU   Understand these instructions.  Will watch your condition.  Will get help right away if you are not doing  well or get worse.  Your e-visit answers were reviewed by a board certified advanced clinical practitioner to complete your personal care plan.  Depending on the condition, your plan could have included both over the counter or prescription medications. If there is a problem please reply  once you have received a response from your provider. Your safety is important to us.  If you have drug allergies check your prescription carefully.    You can use MyChart to ask questions about today's visit, request a non-urgent call back, or ask for a work or school excuse for 24 hours related to this e-Visit. If it has been greater than 24 hours you will need to follow up with your provider, or enter a new e-Visit to address those concerns. You will get an e-mail in the next two days asking about your experience.  I hope that your e-visit has been valuable and will speed your recovery. Thank you for using e-visits.   

## 2015-07-22 MED FILL — DICLOFENAC SOD EC 50 MG TAB: 50 | 30 days supply | Qty: 60 | Fill #1

## 2015-07-22 MED FILL — ATORVASTATIN 10 MG TABLET: 10 | 30 days supply | Qty: 30 | Fill #0

## 2015-08-19 MED FILL — ESTRADIOL 1 MG TABLET: 1 | 30 days supply | Qty: 30 | Fill #0

## 2015-09-03 MED FILL — CYCLOBENZAPRINE 5 MG TABLET: 5 | 20 days supply | Qty: 60 | Fill #0

## 2015-09-18 MED FILL — ATORVASTATIN 10 MG TABLET: 10 | 30 days supply | Qty: 30 | Fill #1

## 2015-09-18 MED FILL — ESTRADIOL 1 MG TABLET: 1 | 30 days supply | Qty: 30 | Fill #1

## 2015-10-04 MED FILL — TRIAMCINOLONE 0.1% CREAM: 0.1 | 26 days supply | Qty: 80 | Fill #0

## 2015-10-22 MED FILL — ESTRADIOL 1 MG TABLET: 1 | 90 days supply | Qty: 90 | Fill #2

## 2015-10-23 MED FILL — ATORVASTATIN 10 MG TABLET: 10 | 30 days supply | Qty: 30 | Fill #2

## 2015-12-27 MED FILL — ATORVASTATIN 10 MG TABLET: 10 | 90 days supply | Qty: 90 | Fill #3

## 2015-12-31 MED FILL — DICLOFENAC SOD EC 50 MG TAB: 50 | 30 days supply | Qty: 60 | Fill #2

## 2016-01-27 MED FILL — ESTRADIOL 1 MG TABLET: 1 | 90 days supply | Qty: 90 | Fill #3

## 2016-01-27 MED FILL — valACYclovir HCL 1 GM TABS: 1 | 15 days supply | Qty: 30 | Fill #0

## 2016-02-12 DIAGNOSIS — K130 Diseases of lips: Secondary | ICD-10-CM | POA: Diagnosis not present

## 2016-02-12 DIAGNOSIS — L570 Actinic keratosis: Secondary | ICD-10-CM | POA: Diagnosis not present

## 2016-02-12 DIAGNOSIS — D224 Melanocytic nevi of scalp and neck: Secondary | ICD-10-CM | POA: Diagnosis not present

## 2016-02-12 MED FILL — FLUOROURACIL 5% CREAM: 5 | 14 days supply | Qty: 40 | Fill #0

## 2016-02-12 MED FILL — HYDROCORTISONE 2.5% CREAM: 2.5 | 14 days supply | Qty: 30 | Fill #0

## 2016-02-18 ENCOUNTER — Other Ambulatory Visit: Payer: Self-pay | Admitting: Obstetrics and Gynecology

## 2016-02-18 DIAGNOSIS — Z1231 Encounter for screening mammogram for malignant neoplasm of breast: Secondary | ICD-10-CM

## 2016-03-25 ENCOUNTER — Ambulatory Visit
Admission: RE | Admit: 2016-03-25 | Discharge: 2016-03-25 | Disposition: A | Discharge status: Home / Self Care | Payer: 59 | Source: Ambulatory Visit | Attending: Obstetrics and Gynecology | Admitting: Obstetrics and Gynecology

## 2016-03-25 ENCOUNTER — Other Ambulatory Visit: Payer: Self-pay | Admitting: Obstetrics and Gynecology

## 2016-03-25 DIAGNOSIS — Z1231 Encounter for screening mammogram for malignant neoplasm of breast: Secondary | ICD-10-CM

## 2016-03-27 MED FILL — ATORVASTATIN 10 MG TABLET: 10 | 90 days supply | Qty: 90 | Fill #4

## 2016-03-27 MED FILL — DICLOFENAC SOD EC 50 MG TAB: 50 | 30 days supply | Qty: 60 | Fill #3

## 2016-03-31 DIAGNOSIS — M859 Disorder of bone density and structure, unspecified: Secondary | ICD-10-CM | POA: Diagnosis not present

## 2016-03-31 DIAGNOSIS — R358 Other polyuria: Secondary | ICD-10-CM | POA: Diagnosis not present

## 2016-03-31 DIAGNOSIS — R8299 Other abnormal findings in urine: Secondary | ICD-10-CM | POA: Diagnosis not present

## 2016-03-31 DIAGNOSIS — Z0000 Encounter for general adult medical examination without abnormal findings: Secondary | ICD-10-CM | POA: Diagnosis not present

## 2016-04-08 DIAGNOSIS — Z01419 Encounter for gynecological examination (general) (routine) without abnormal findings: Secondary | ICD-10-CM | POA: Diagnosis not present

## 2016-04-08 DIAGNOSIS — Z6826 Body mass index (BMI) 26.0-26.9, adult: Secondary | ICD-10-CM | POA: Diagnosis not present

## 2016-04-09 DIAGNOSIS — B009 Herpesviral infection, unspecified: Secondary | ICD-10-CM | POA: Diagnosis not present

## 2016-04-09 DIAGNOSIS — B351 Tinea unguium: Secondary | ICD-10-CM | POA: Diagnosis not present

## 2016-04-09 DIAGNOSIS — Z1389 Encounter for screening for other disorder: Secondary | ICD-10-CM | POA: Diagnosis not present

## 2016-04-09 DIAGNOSIS — L218 Other seborrheic dermatitis: Secondary | ICD-10-CM | POA: Diagnosis not present

## 2016-04-09 DIAGNOSIS — E784 Other hyperlipidemia: Secondary | ICD-10-CM | POA: Diagnosis not present

## 2016-04-09 DIAGNOSIS — R51 Headache: Secondary | ICD-10-CM | POA: Diagnosis not present

## 2016-04-09 DIAGNOSIS — M859 Disorder of bone density and structure, unspecified: Secondary | ICD-10-CM | POA: Diagnosis not present

## 2016-04-09 DIAGNOSIS — M25552 Pain in left hip: Secondary | ICD-10-CM | POA: Diagnosis not present

## 2016-04-09 DIAGNOSIS — Z832 Family history of diseases of the blood and blood-forming organs and certain disorders involving the immune mechanism: Secondary | ICD-10-CM | POA: Diagnosis not present

## 2016-04-09 DIAGNOSIS — Z0000 Encounter for general adult medical examination without abnormal findings: Secondary | ICD-10-CM | POA: Diagnosis not present

## 2016-04-09 DIAGNOSIS — H9313 Tinnitus, bilateral: Secondary | ICD-10-CM | POA: Diagnosis not present

## 2016-04-14 DIAGNOSIS — Z1212 Encounter for screening for malignant neoplasm of rectum: Secondary | ICD-10-CM | POA: Diagnosis not present

## 2016-04-17 ENCOUNTER — Encounter: Payer: Self-pay | Admitting: Hematology and Oncology

## 2016-04-17 ENCOUNTER — Telehealth: Payer: Self-pay | Admitting: Hematology and Oncology

## 2016-04-17 NOTE — Telephone Encounter (Signed)
Appt scheduled w/Gudena on 1/18 at 1pm. Pt aware to arrive 30 minutes early. Demographics verified Letter mailed to the pt.

## 2016-04-30 DIAGNOSIS — H5203 Hypermetropia, bilateral: Secondary | ICD-10-CM | POA: Diagnosis not present

## 2016-04-30 DIAGNOSIS — H524 Presbyopia: Secondary | ICD-10-CM | POA: Diagnosis not present

## 2016-05-13 ENCOUNTER — Telehealth: Payer: Self-pay | Admitting: Hematology and Oncology

## 2016-05-13 NOTE — Telephone Encounter (Signed)
Patient called to cancel her appointment tomorrow 1/18 due to bad weather.  She need to re schedule for one day next week call back is [phone]

## 2016-05-14 ENCOUNTER — Encounter: Payer: Self-pay | Admitting: Hematology and Oncology

## 2016-05-20 ENCOUNTER — Encounter: Payer: Self-pay | Admitting: Hematology and Oncology

## 2016-05-20 ENCOUNTER — Ambulatory Visit: Payer: Self-pay | Admitting: Hematology and Oncology

## 2016-05-20 ENCOUNTER — Ambulatory Visit (HOSPITAL_BASED_OUTPATIENT_CLINIC_OR_DEPARTMENT_OTHER): Payer: 59 | Admitting: Hematology and Oncology

## 2016-05-20 DIAGNOSIS — D6852 Prothrombin gene mutation: Secondary | ICD-10-CM | POA: Diagnosis not present

## 2016-05-20 NOTE — Assessment & Plan Note (Signed)
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 Workup done by Dr. Joylene Draft revealed heterozygous prothrombin gene mutation.  Counseling: I discussed with her that thrombin gene mutation heterozygous can be present in 1% of [place] population and it increases the risk of blood clots 3-4 times normal. It increases the risk of venous thrombosis. The risk of arterial thrombosis is fairly minimal. The risk of recurrent blood clots is also not found to be markedly elevated on someone has an existing blood clot. She does not have factor V Leiden. It appears that her son may have factor V Leiden. I explained the coagulation pathway and how prothrombin gene mutation leads to increased risk of thrombosis.  Recommendations: 1. Patient needs to stop estrogen replacement therapy. 2. I discussed with her that she needs to avoid situations where the risk of blood clot would increase including during travel, surgeries etc. She is a prior smoker and I discussed with her that she cannot resume cigarette smoking. 3. Recommended that she exercise daily and stay physically active. There is no role of anticoagulation without the existence of blood clots.  Return to clinic on an as-needed basis.

## 2016-05-20 NOTE — Progress Notes (Signed)
[place] CONSULT NOTE  Patient Care Team: Crist Infante, MD as PCP - General (Internal Medicine)  CHIEF COMPLAINTS/PURPOSE OF CONSULTATION:  Prothrombin gene mutation  HISTORY OF PRESENTING ILLNESS:  Lori Ruiz 66 y.o. female is here because of recent diagnosis of prothrombin gene mutation. Patient has a family history of MEN-2 syndrome. Her daughter had a stroke and her son had pulmonary embolism. Her mother had extensive DVTs. Because of her family history she had extensive blood work by Dr. Joylene Draft which revealed that she was heterozygous for prothrombin gene mutation [id number]. She was sent was for discussion regarding the risk of blood clots as well as any role of preventive medications. Patient works as a Chartered loss adjuster at [employer] and stays very busy.  MEDICAL HISTORY:  Hyperlipidemia  SURGICAL HISTORY: Past Surgical History:  Procedure Laterality Date  . ABDOMINAL HYSTERECTOMY    . ABDOMINAL SURGERY    . APPENDECTOMY    . TONSILLECTOMY      SOCIAL HISTORY:Prior smoker quit in 1982 15-pack-year smoking, denies any alcohol use. Denies any drug abuse. FAMILY HISTORY: Family history of blood clots as mentioned above, MEN2 syndrome in her sister  ALLERGIES:  has No Known Allergies.  MEDICATIONS:  Current Outpatient Prescriptions  Medication Sig Dispense Refill  . aspirin 81 MG chewable tablet Chew 81 mg by mouth every other day.     Marland Kitchen atorvastatin (LIPITOR) 10 MG tablet   10  . benzonatate (TESSALON PERLES) 100 MG capsule Take 1-2 capsules (100-200 mg total) by mouth every 8 (eight) hours as needed for cough. 30 capsule 0  . Calcium-Vitamin D-Vitamin K (VIACTIV PO) Take 1 tablet by mouth daily.    . diclofenac (VOLTAREN) 50 MG EC tablet Take 50 mg by mouth 2 (two) times daily as needed for moderate pain (finger joint pain).   4  . diclofenac sodium (VOLTAREN) 1 % GEL Apply 2 g topically 4 (four) times daily as needed (for finger joint pain).   3   . estradiol (ESTRACE) 1 MG tablet   11  . valACYclovir (VALTREX) 500 MG tablet Take 500-1,000 mg by mouth daily as needed (for fever blisters, take 2 tablets on day 1, take 1 tablet daily for 4 days then stop).    Marland Kitchen VITAMIN E PO Take 1 capsule by mouth daily.     No current facility-administered medications for this visit.     REVIEW OF SYSTEMS:   Constitutional: Denies fevers, chills or abnormal night sweats Eyes: Denies blurriness of vision, double vision or watery eyes Ears, nose, mouth, throat, and face: Denies mucositis or sore throat Respiratory: Denies cough, dyspnea or wheezes Cardiovascular: Denies palpitation, chest discomfort or lower extremity swelling Gastrointestinal:  Denies nausea, heartburn or change in bowel habits Skin: Denies abnormal skin rashes Lymphatics: Denies new lymphadenopathy or easy bruising Neurological:Denies numbness, tingling or new weaknesses Behavioral/Psych: Mood is stable, no new changes  All other systems were reviewed with the patient and are negative.  PHYSICAL EXAMINATION: ECOG PERFORMANCE STATUS: 1 - Symptomatic but completely ambulatory  Vitals:   05/20/16 0937  BP: 120/69  Pulse: 75  Resp: 18  Temp: 97.5 F (36.4 C)   Filed Weights   05/20/16 0937  Weight: 151 lb 3.2 oz (68.6 kg)    GENERAL:alert, no distress and comfortable SKIN: skin color, texture, turgor are normal, no rashes or significant lesions EYES: normal, conjunctiva are pink and non-injected, sclera clear OROPHARYNX:no exudate, no erythema and lips, buccal mucosa, and tongue normal  NECK: supple, thyroid normal size, non-tender, without nodularity LYMPH:  no palpable lymphadenopathy in the cervical, axillary or inguinal LUNGS: clear to auscultation and percussion with normal breathing effort HEART: regular rate & rhythm and no murmurs and no lower extremity edema ABDOMEN:abdomen soft, non-tender and normal bowel sounds Musculoskeletal:no cyanosis of digits and no  clubbing  PSYCH: alert & oriented x 3 with fluent speech NEURO: no focal motor/sensory deficits  LABORATORY DATA:  I have reviewed the data as listed Lab Results  Component Value Date   WBC 8.8 11/23/2008   HGB 13.9 11/23/2008   HCT 40.1 11/23/2008   MCV 89.8 11/23/2008   PLT 232 11/23/2008   Lab Results  Component Value Date   NA 138 11/23/2008   K 3.8 11/23/2008   CL 105 11/23/2008   CO2 27 11/23/2008    RADIOGRAPHIC STUDIES: I have personally reviewed the radiological reports and agreed with the findings in the report.  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 Workup done by Dr. Joylene Draft revealed heterozygous prothrombin gene mutation.  Counseling: I discussed with her that thrombin gene mutation heterozygous can be present in 1% of [place] population and it increases the risk of blood clots 3-4 times normal. It increases the risk of venous thrombosis. The risk of arterial thrombosis is fairly minimal. The risk of recurrent blood clots is also not found to be markedly elevated on someone has an existing blood clot. She does not have factor V Leiden. It appears that her son may have factor V Leiden. I explained the coagulation pathway and how prothrombin gene mutation leads to increased risk of thrombosis.  Recommendations: 1. Patient needs to stop estrogen replacement therapy. 2. I discussed with her that she needs to avoid situations where the risk of blood clot would increase including during travel, surgeries etc. She is a prior smoker and I discussed with her that she cannot resume cigarette smoking. 3. Recommended that she exercise daily and stay physically active. There is no role of anticoagulation without the existence of blood clots.  Return to clinic on an as-needed  basis.   All questions were answered. The patient knows to call the clinic with any problems, questions or concerns.    Rulon Eisenmenger, MD 05/20/16

## 2016-05-25 ENCOUNTER — Ambulatory Visit: Payer: Self-pay | Admitting: Hematology and Oncology

## 2016-06-08 MED FILL — ATORVASTATIN 10 MG TABLET: 10 | 90 days supply | Qty: 90 | Fill #5

## 2016-06-24 MED FILL — HYDROCORTISONE 2.5% CREAM: 2.5 | 14 days supply | Qty: 30 | Fill #1

## 2016-06-24 MED FILL — valACYclovir HCL 1 GM TABS: 1 | 15 days supply | Qty: 30 | Fill #1

## 2016-09-09 MED FILL — ATORVASTATIN 10 MG TABLET: 10 | 90 days supply | Qty: 90 | Fill #0

## 2016-10-08 MED FILL — valACYclovir HCL 1 GM TABS: 1 | 15 days supply | Qty: 30 | Fill #2

## 2016-10-21 MED FILL — TRIAMCINOLONE 0.1% CREAM: 0.1 | 30 days supply | Qty: 80 | Fill #0

## 2016-12-03 MED FILL — ATORVASTATIN 10 MG TABLET: 10 | 90 days supply | Qty: 90 | Fill #1

## 2016-12-03 MED FILL — HYDROCORTISONE 2.5% CREAM: 2.5 | 14 days supply | Qty: 30 | Fill #0

## 2017-01-28 DIAGNOSIS — R194 Change in bowel habit: Secondary | ICD-10-CM | POA: Diagnosis not present

## 2017-01-28 DIAGNOSIS — R159 Full incontinence of feces: Secondary | ICD-10-CM | POA: Diagnosis not present

## 2017-01-28 DIAGNOSIS — K5904 Chronic idiopathic constipation: Secondary | ICD-10-CM | POA: Diagnosis not present

## 2017-02-19 ENCOUNTER — Other Ambulatory Visit: Payer: Self-pay | Admitting: Obstetrics and Gynecology

## 2017-02-19 DIAGNOSIS — Z1231 Encounter for screening mammogram for malignant neoplasm of breast: Secondary | ICD-10-CM

## 2017-03-09 MED FILL — ATORVASTATIN 10 MG TABLET: 10 | 90 days supply | Qty: 90 | Fill #2

## 2017-03-26 ENCOUNTER — Ambulatory Visit
Admission: RE | Admit: 2017-03-26 | Discharge: 2017-03-26 | Disposition: A | Discharge status: Home / Self Care | Payer: 59 | Source: Ambulatory Visit | Attending: Obstetrics and Gynecology | Admitting: Obstetrics and Gynecology

## 2017-03-26 DIAGNOSIS — Z1231 Encounter for screening mammogram for malignant neoplasm of breast: Secondary | ICD-10-CM

## 2017-04-13 DIAGNOSIS — Z01419 Encounter for gynecological examination (general) (routine) without abnormal findings: Secondary | ICD-10-CM | POA: Diagnosis not present

## 2017-04-13 DIAGNOSIS — Z6825 Body mass index (BMI) 25.0-25.9, adult: Secondary | ICD-10-CM | POA: Diagnosis not present

## 2017-04-30 MED FILL — LINZESS 290 MCG CAPSULE: 290 | 90 days supply | Qty: 90 | Fill #0

## 2017-05-03 DIAGNOSIS — H5203 Hypermetropia, bilateral: Secondary | ICD-10-CM | POA: Diagnosis not present

## 2017-05-03 DIAGNOSIS — H2513 Age-related nuclear cataract, bilateral: Secondary | ICD-10-CM | POA: Diagnosis not present

## 2017-05-03 DIAGNOSIS — H524 Presbyopia: Secondary | ICD-10-CM | POA: Diagnosis not present

## 2017-05-27 DIAGNOSIS — N390 Urinary tract infection, site not specified: Secondary | ICD-10-CM | POA: Diagnosis not present

## 2017-05-27 DIAGNOSIS — Z0000 Encounter for general adult medical examination without abnormal findings: Secondary | ICD-10-CM | POA: Diagnosis not present

## 2017-05-27 DIAGNOSIS — M859 Disorder of bone density and structure, unspecified: Secondary | ICD-10-CM | POA: Diagnosis not present

## 2017-06-03 DIAGNOSIS — Z8341 Family history of multiple endocrine neoplasia [MEN] syndrome: Secondary | ICD-10-CM | POA: Diagnosis not present

## 2017-06-03 DIAGNOSIS — R3121 Asymptomatic microscopic hematuria: Secondary | ICD-10-CM | POA: Diagnosis not present

## 2017-06-03 DIAGNOSIS — L218 Other seborrheic dermatitis: Secondary | ICD-10-CM | POA: Diagnosis not present

## 2017-06-03 DIAGNOSIS — D6852 Prothrombin gene mutation: Secondary | ICD-10-CM | POA: Diagnosis not present

## 2017-06-03 DIAGNOSIS — Z0000 Encounter for general adult medical examination without abnormal findings: Secondary | ICD-10-CM | POA: Diagnosis not present

## 2017-06-03 DIAGNOSIS — R82998 Other abnormal findings in urine: Secondary | ICD-10-CM | POA: Diagnosis not present

## 2017-06-03 DIAGNOSIS — R5383 Other fatigue: Secondary | ICD-10-CM | POA: Diagnosis not present

## 2017-06-03 DIAGNOSIS — B351 Tinea unguium: Secondary | ICD-10-CM | POA: Diagnosis not present

## 2017-06-03 DIAGNOSIS — N390 Urinary tract infection, site not specified: Secondary | ICD-10-CM | POA: Diagnosis not present

## 2017-06-03 DIAGNOSIS — Z1389 Encounter for screening for other disorder: Secondary | ICD-10-CM | POA: Diagnosis not present

## 2017-06-03 DIAGNOSIS — Z832 Family history of diseases of the blood and blood-forming organs and certain disorders involving the immune mechanism: Secondary | ICD-10-CM | POA: Diagnosis not present

## 2017-06-03 DIAGNOSIS — H9313 Tinnitus, bilateral: Secondary | ICD-10-CM | POA: Diagnosis not present

## 2017-06-03 MED FILL — ATORVASTATIN 10 MG TABLET: 10 | 90 days supply | Qty: 90 | Fill #0

## 2017-06-04 MED FILL — CEFDINIR 300 MG CAPSULE: 300 | 5 days supply | Qty: 10 | Fill #0

## 2017-06-10 DIAGNOSIS — Z1212 Encounter for screening for malignant neoplasm of rectum: Secondary | ICD-10-CM | POA: Diagnosis not present

## 2017-06-10 DIAGNOSIS — M859 Disorder of bone density and structure, unspecified: Secondary | ICD-10-CM | POA: Diagnosis not present

## 2017-07-08 MED FILL — SHINGRIX 50 MCG SUS: 50 | 1 days supply | Qty: 1 | Fill #0

## 2017-08-26 MED FILL — ATORVASTATIN 10 MG TABLET: 10 | 90 days supply | Qty: 90 | Fill #1

## 2017-10-07 MED FILL — SHINGRIX 50 MCG SUS: 50 | 1 days supply | Qty: 1 | Fill #1

## 2017-10-29 MED FILL — TRIAMCINOLONE ACETONIDE 0.1: 0.1 | 20 days supply | Qty: 60 | Fill #0

## 2017-11-29 MED FILL — ATORVASTATIN 10 MG TABLET: 10 | 90 days supply | Qty: 90 | Fill #2

## 2018-02-10 ENCOUNTER — Other Ambulatory Visit: Payer: Self-pay | Admitting: Obstetrics and Gynecology

## 2018-02-10 DIAGNOSIS — Z1231 Encounter for screening mammogram for malignant neoplasm of breast: Secondary | ICD-10-CM

## 2018-03-02 DIAGNOSIS — L720 Epidermal cyst: Secondary | ICD-10-CM | POA: Diagnosis not present

## 2018-03-02 DIAGNOSIS — D2262 Melanocytic nevi of left upper limb, including shoulder: Secondary | ICD-10-CM | POA: Diagnosis not present

## 2018-03-02 DIAGNOSIS — D485 Neoplasm of uncertain behavior of skin: Secondary | ICD-10-CM | POA: Diagnosis not present

## 2018-03-02 DIAGNOSIS — D2261 Melanocytic nevi of right upper limb, including shoulder: Secondary | ICD-10-CM | POA: Diagnosis not present

## 2018-03-02 DIAGNOSIS — D1801 Hemangioma of skin and subcutaneous tissue: Secondary | ICD-10-CM | POA: Diagnosis not present

## 2018-03-02 DIAGNOSIS — D225 Melanocytic nevi of trunk: Secondary | ICD-10-CM | POA: Diagnosis not present

## 2018-03-02 DIAGNOSIS — L821 Other seborrheic keratosis: Secondary | ICD-10-CM | POA: Diagnosis not present

## 2018-03-02 MED FILL — ATORVASTATIN 10 MG TABLET: 10 | 90 days supply | Qty: 90 | Fill #3

## 2018-03-15 DIAGNOSIS — D485 Neoplasm of uncertain behavior of skin: Secondary | ICD-10-CM | POA: Diagnosis not present

## 2018-03-15 DIAGNOSIS — L988 Other specified disorders of the skin and subcutaneous tissue: Secondary | ICD-10-CM | POA: Diagnosis not present

## 2018-03-28 ENCOUNTER — Ambulatory Visit
Admission: RE | Admit: 2018-03-28 | Discharge: 2018-03-28 | Disposition: A | Discharge status: Home / Self Care | Payer: 59 | Source: Ambulatory Visit | Attending: Obstetrics and Gynecology | Admitting: Obstetrics and Gynecology

## 2018-03-28 DIAGNOSIS — Z1231 Encounter for screening mammogram for malignant neoplasm of breast: Secondary | ICD-10-CM

## 2018-04-15 DIAGNOSIS — Z01419 Encounter for gynecological examination (general) (routine) without abnormal findings: Secondary | ICD-10-CM | POA: Diagnosis not present

## 2018-04-15 DIAGNOSIS — Z6825 Body mass index (BMI) 25.0-25.9, adult: Secondary | ICD-10-CM | POA: Diagnosis not present

## 2018-05-04 MED FILL — MYRBETRIQ ER 25 MG TABLET: 25 | 30 days supply | Qty: 30 | Fill #0

## 2018-05-09 DIAGNOSIS — H5203 Hypermetropia, bilateral: Secondary | ICD-10-CM | POA: Diagnosis not present

## 2018-05-09 DIAGNOSIS — H524 Presbyopia: Secondary | ICD-10-CM | POA: Diagnosis not present

## 2018-05-09 DIAGNOSIS — H2513 Age-related nuclear cataract, bilateral: Secondary | ICD-10-CM | POA: Diagnosis not present

## 2018-05-25 MED FILL — ATORVASTATIN 10 MG TABLET: 10 | 90 days supply | Qty: 90 | Fill #0

## 2018-06-27 DIAGNOSIS — R82998 Other abnormal findings in urine: Secondary | ICD-10-CM | POA: Diagnosis not present

## 2018-06-27 DIAGNOSIS — Z0000 Encounter for general adult medical examination without abnormal findings: Secondary | ICD-10-CM | POA: Diagnosis not present

## 2018-06-27 DIAGNOSIS — M859 Disorder of bone density and structure, unspecified: Secondary | ICD-10-CM | POA: Diagnosis not present

## 2018-07-01 DIAGNOSIS — Z0000 Encounter for general adult medical examination without abnormal findings: Secondary | ICD-10-CM | POA: Diagnosis not present

## 2018-07-01 DIAGNOSIS — R3121 Asymptomatic microscopic hematuria: Secondary | ICD-10-CM | POA: Diagnosis not present

## 2018-07-01 DIAGNOSIS — R5383 Other fatigue: Secondary | ICD-10-CM | POA: Diagnosis not present

## 2018-07-01 DIAGNOSIS — H9313 Tinnitus, bilateral: Secondary | ICD-10-CM | POA: Diagnosis not present

## 2018-07-01 DIAGNOSIS — L218 Other seborrheic dermatitis: Secondary | ICD-10-CM | POA: Diagnosis not present

## 2018-07-01 DIAGNOSIS — Z832 Family history of diseases of the blood and blood-forming organs and certain disorders involving the immune mechanism: Secondary | ICD-10-CM | POA: Diagnosis not present

## 2018-07-01 DIAGNOSIS — Z8341 Family history of multiple endocrine neoplasia [MEN] syndrome: Secondary | ICD-10-CM | POA: Diagnosis not present

## 2018-07-01 DIAGNOSIS — D6852 Prothrombin gene mutation: Secondary | ICD-10-CM | POA: Diagnosis not present

## 2018-07-01 DIAGNOSIS — M859 Disorder of bone density and structure, unspecified: Secondary | ICD-10-CM | POA: Diagnosis not present

## 2018-07-01 DIAGNOSIS — N183 Chronic kidney disease, stage 3 (moderate): Secondary | ICD-10-CM | POA: Diagnosis not present

## 2018-07-01 MED FILL — valACYclovir HCL 1 GM TABS: 1 | 5 days supply | Qty: 20 | Fill #0

## 2018-07-05 DIAGNOSIS — Z1212 Encounter for screening for malignant neoplasm of rectum: Secondary | ICD-10-CM | POA: Diagnosis not present

## 2018-08-08 MED FILL — ATORVASTATIN 10 MG TABLET: 10 | 90 days supply | Qty: 90 | Fill #0

## 2018-10-10 MED FILL — TRIAMCINOLONE 0.1% CREAM: 0.1 | 30 days supply | Qty: 60 | Fill #0

## 2018-11-11 MED FILL — MYRBETRIQ ER 25 MG TABLET: 25 | 30 days supply | Qty: 30 | Fill #1

## 2018-11-11 MED FILL — TRIAMCINOLONE 0.1% CREAM: 0.1 | 30 days supply | Qty: 60 | Fill #1

## 2018-11-11 MED FILL — ATORVASTATIN 10 MG TABLET: 10 | 90 days supply | Qty: 90 | Fill #0

## 2019-01-11 MED FILL — valACYclovir HCL 1 GM TABS: 1 | 5 days supply | Qty: 20 | Fill #1

## 2019-02-03 MED FILL — NYSTATIN 100,000 UNITS/ML S: 100000 | 13 days supply | Qty: 300 | Fill #0

## 2019-02-13 ENCOUNTER — Other Ambulatory Visit: Payer: Self-pay | Admitting: Obstetrics and Gynecology

## 2019-02-13 DIAGNOSIS — Z1231 Encounter for screening mammogram for malignant neoplasm of breast: Secondary | ICD-10-CM

## 2019-02-20 MED FILL — ATORVASTATIN 10 MG TABLET: 10 | 90 days supply | Qty: 90 | Fill #1

## 2019-04-04 ENCOUNTER — Ambulatory Visit
Admission: RE | Admit: 2019-04-04 | Discharge: 2019-04-04 | Disposition: A | Discharge status: Home / Self Care | Payer: 59 | Source: Ambulatory Visit | Attending: Obstetrics and Gynecology | Admitting: Obstetrics and Gynecology

## 2019-04-04 ENCOUNTER — Other Ambulatory Visit: Payer: Self-pay

## 2019-04-04 DIAGNOSIS — Z1231 Encounter for screening mammogram for malignant neoplasm of breast: Secondary | ICD-10-CM

## 2019-04-11 MED FILL — MYRBETRIQ ER 25 MG TABLET: 25 | 30 days supply | Qty: 30 | Fill #2

## 2019-04-18 DIAGNOSIS — Z6825 Body mass index (BMI) 25.0-25.9, adult: Secondary | ICD-10-CM | POA: Diagnosis not present

## 2019-04-18 DIAGNOSIS — Z01419 Encounter for gynecological examination (general) (routine) without abnormal findings: Secondary | ICD-10-CM | POA: Diagnosis not present

## 2019-04-18 MED FILL — NITROFURANTOIN MONO-MCR 100: 100 | 7 days supply | Qty: 14 | Fill #0

## 2019-04-27 DIAGNOSIS — L720 Epidermal cyst: Secondary | ICD-10-CM | POA: Diagnosis not present

## 2019-04-27 DIAGNOSIS — D2371 Other benign neoplasm of skin of right lower limb, including hip: Secondary | ICD-10-CM | POA: Diagnosis not present

## 2019-04-27 DIAGNOSIS — D1801 Hemangioma of skin and subcutaneous tissue: Secondary | ICD-10-CM | POA: Diagnosis not present

## 2019-04-27 DIAGNOSIS — D225 Melanocytic nevi of trunk: Secondary | ICD-10-CM | POA: Diagnosis not present

## 2019-04-27 DIAGNOSIS — L821 Other seborrheic keratosis: Secondary | ICD-10-CM | POA: Diagnosis not present

## 2019-04-27 DIAGNOSIS — D2272 Melanocytic nevi of left lower limb, including hip: Secondary | ICD-10-CM | POA: Diagnosis not present

## 2019-05-11 DIAGNOSIS — H5203 Hypermetropia, bilateral: Secondary | ICD-10-CM | POA: Diagnosis not present

## 2019-05-11 DIAGNOSIS — H524 Presbyopia: Secondary | ICD-10-CM | POA: Diagnosis not present

## 2019-05-16 MED FILL — ATORVASTATIN 10 MG TABLET: 10 | 90 days supply | Qty: 90 | Fill #0

## 2019-06-01 MED FILL — valACYclovir HCL 1 GM TABS: 1 | 5 days supply | Qty: 20 | Fill #2

## 2019-06-14 MED FILL — NYSTATIN-TRIAMCINOLONE OINT: 100000-0.1 | 20 days supply | Qty: 15 | Fill #0

## 2019-07-27 DIAGNOSIS — E7849 Other hyperlipidemia: Secondary | ICD-10-CM | POA: Diagnosis not present

## 2019-07-27 DIAGNOSIS — M8589 Other specified disorders of bone density and structure, multiple sites: Secondary | ICD-10-CM | POA: Diagnosis not present

## 2019-07-27 DIAGNOSIS — Z0000 Encounter for general adult medical examination without abnormal findings: Secondary | ICD-10-CM | POA: Diagnosis not present

## 2019-07-27 DIAGNOSIS — M859 Disorder of bone density and structure, unspecified: Secondary | ICD-10-CM | POA: Diagnosis not present

## 2019-07-28 MED FILL — ATORVASTATIN 10 MG TABLET: 10 | 90 days supply | Qty: 90 | Fill #1

## 2019-07-31 IMAGING — MG DIGITAL SCREENING BILATERAL MAMMOGRAM WITH TOMO AND CAD
8 series · 8 of 24 positions shown · non-contrast
Comparison: Previous exam(s).

CLINICAL DATA: Screening.

EXAM:
DIGITAL SCREENING BILATERAL MAMMOGRAM WITH TOMO AND CAD

[L MLO synth-2D]
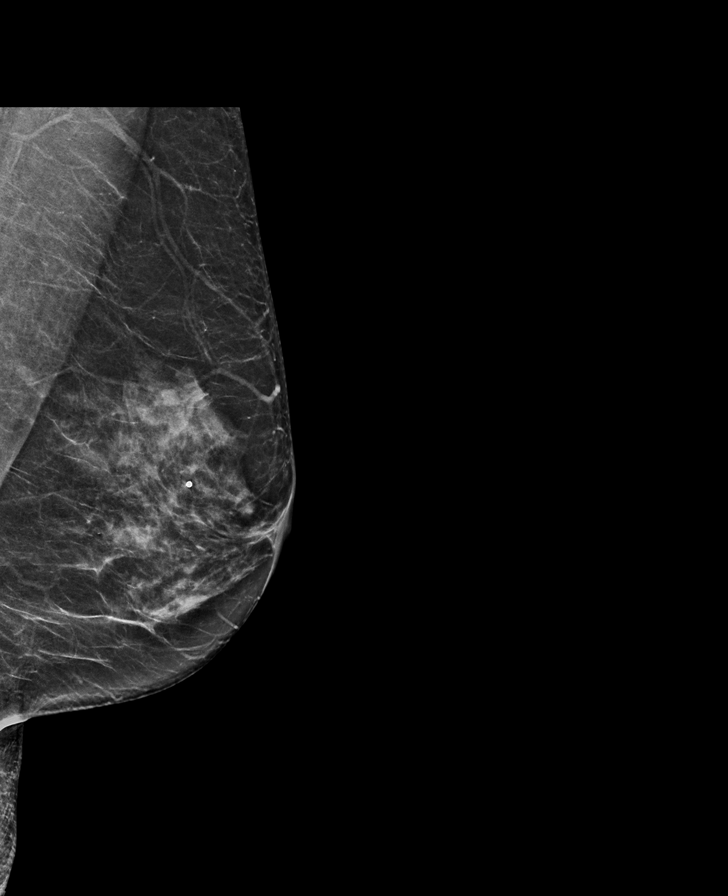

[R CC synth-2D]
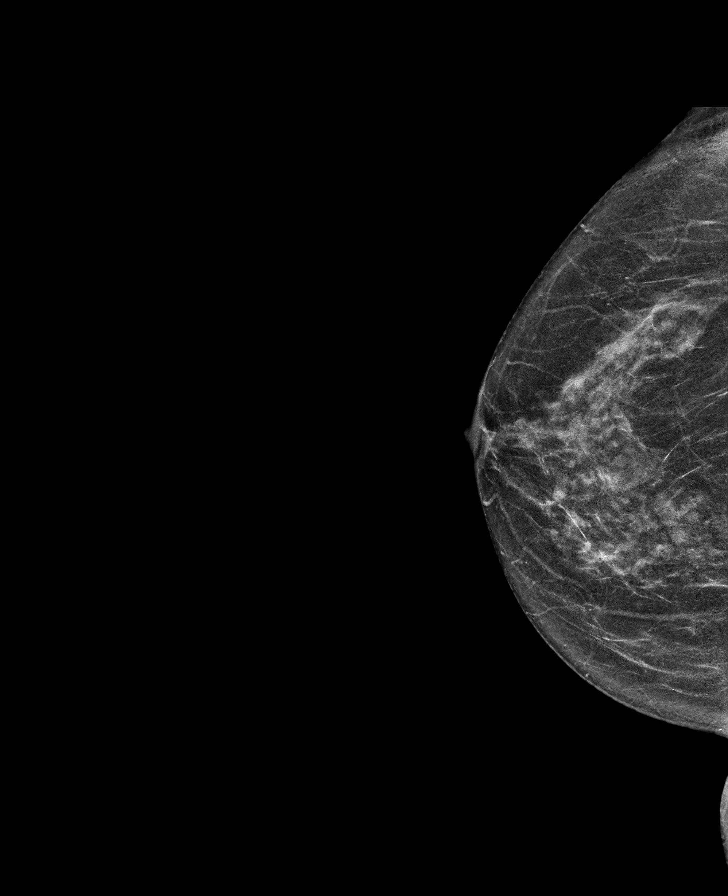

[R MLO synth-2D]
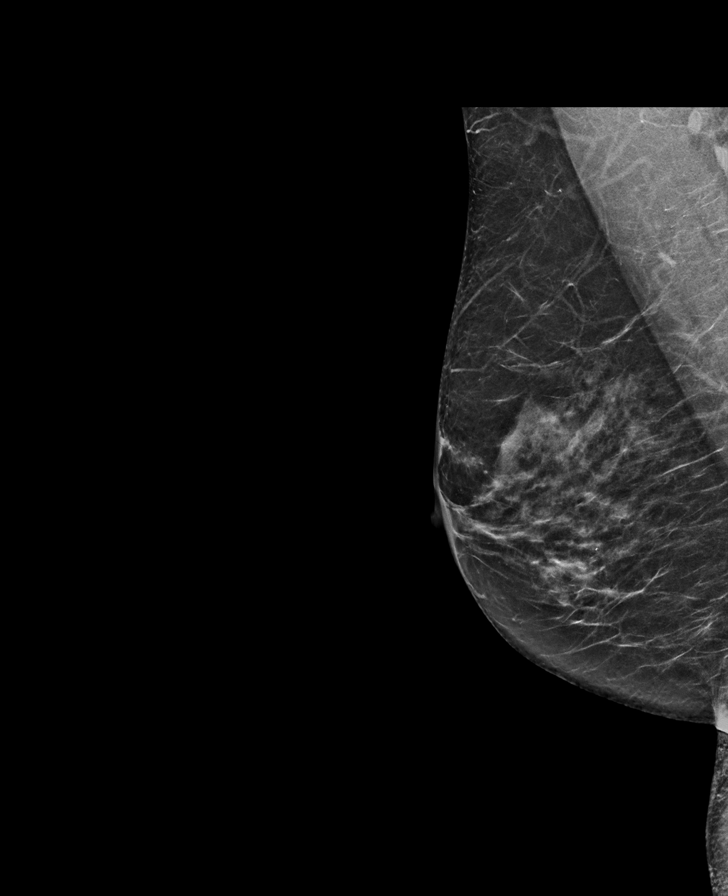

[L CC synth-2D]
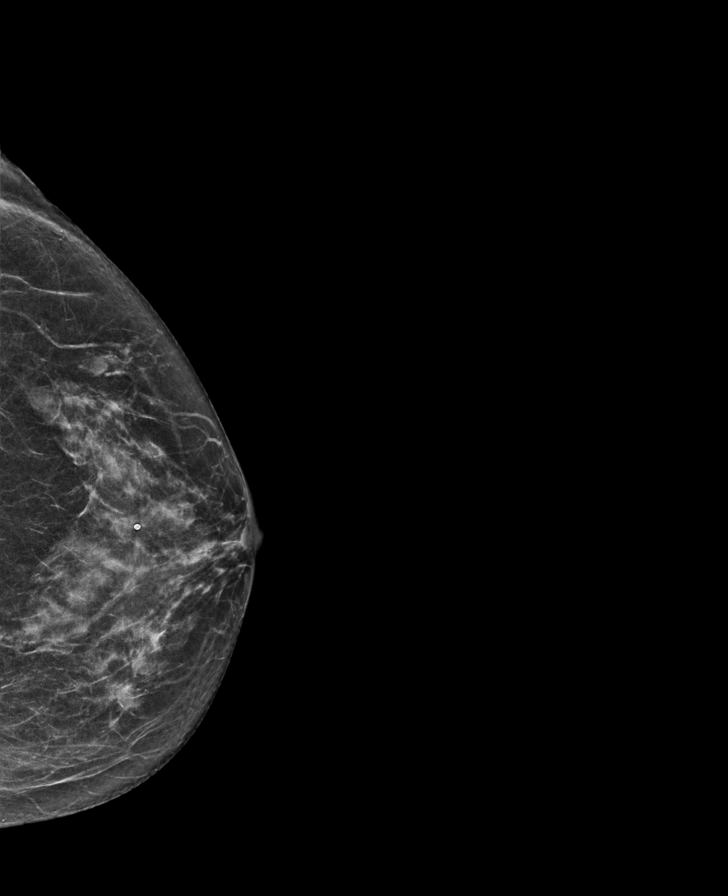

[L CC tomo · tomo slice 30/59.0]
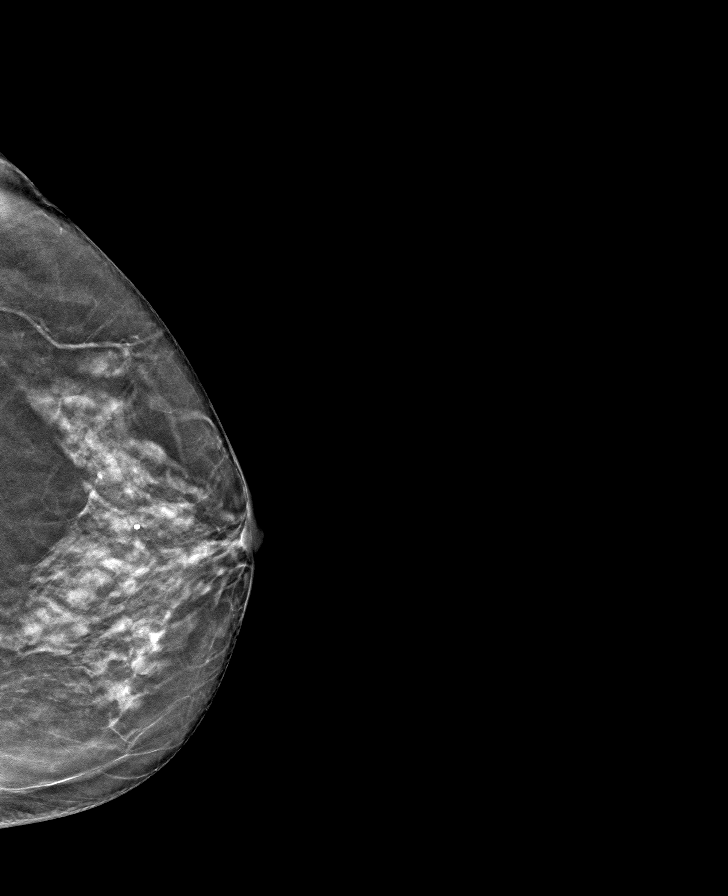

[L MLO tomo · tomo slice 34/67.0]
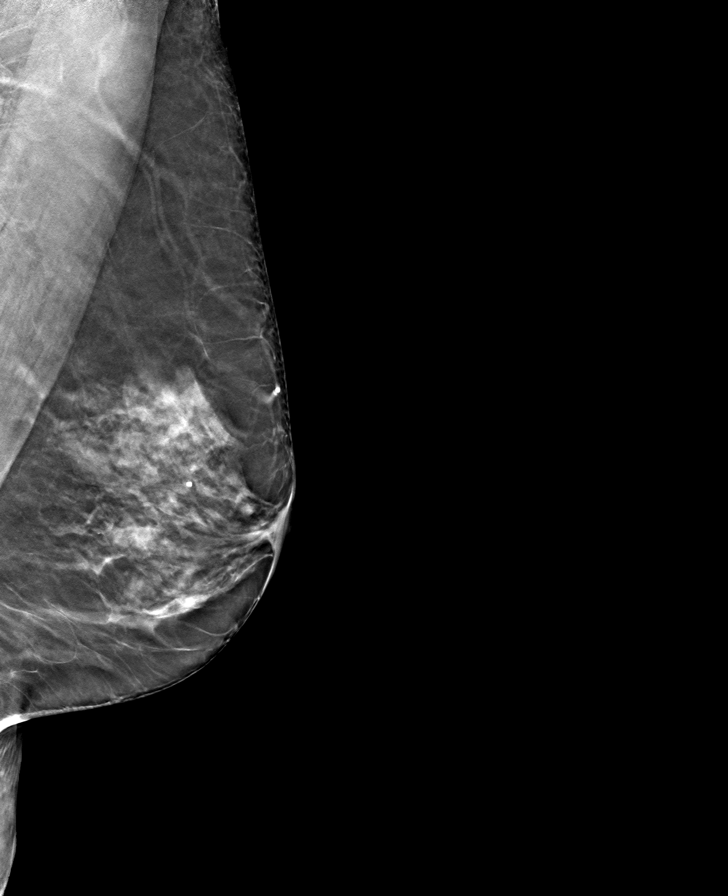

[R CC tomo · tomo slice 33/64.0]
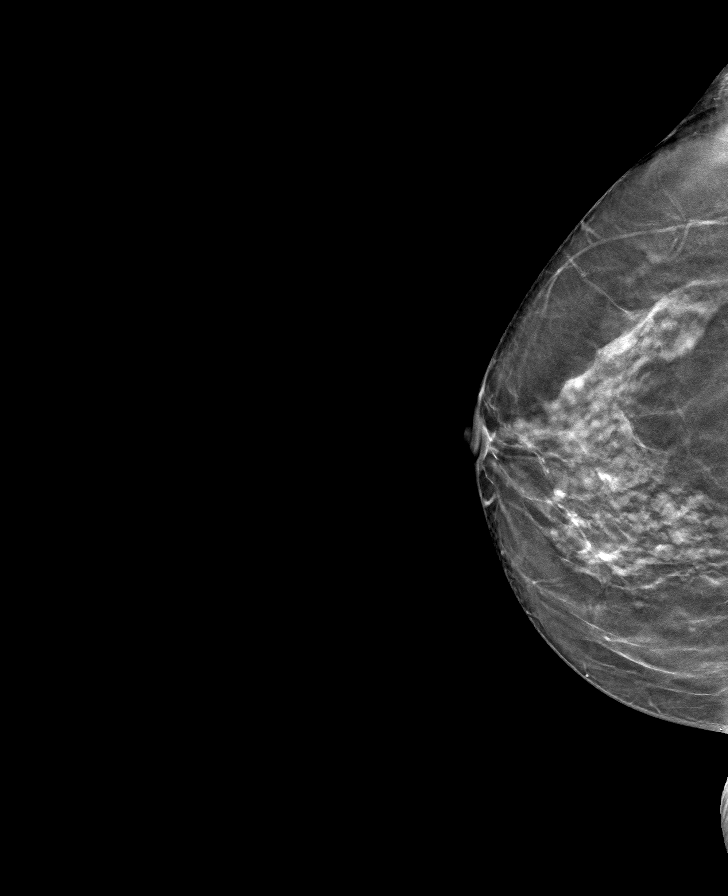

[R MLO tomo · tomo slice 33/66.0]
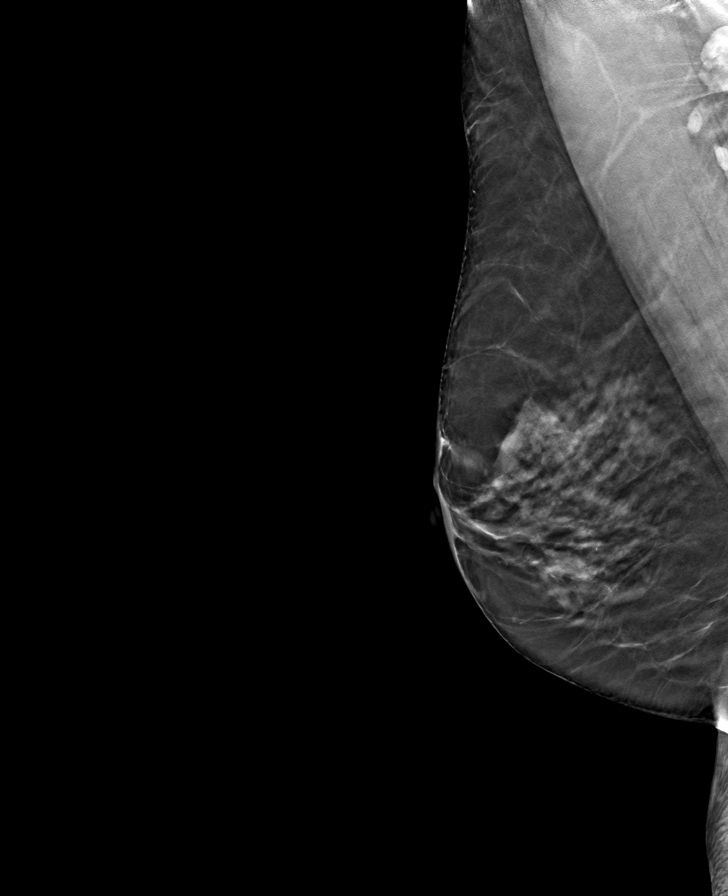

[8 of 24 positions shown; findings below may reference images not displayed]

ACR Breast Density Category c: The breast tissue is heterogeneously
dense, which may obscure small masses.
FINDINGS: There are no findings suspicious for malignancy. Images were
processed with CAD.
IMPRESSION: No mammographic evidence of malignancy. A result letter of this
screening mammogram will be mailed directly to the patient.

RECOMMENDATION:
Screening mammogram in one year. (Code:FT-U-LHB)

BI-RADS CATEGORY  1: Negative.

## 2019-08-04 DIAGNOSIS — D6852 Prothrombin gene mutation: Secondary | ICD-10-CM | POA: Diagnosis not present

## 2019-08-04 DIAGNOSIS — M859 Disorder of bone density and structure, unspecified: Secondary | ICD-10-CM | POA: Diagnosis not present

## 2019-08-04 DIAGNOSIS — R82998 Other abnormal findings in urine: Secondary | ICD-10-CM | POA: Diagnosis not present

## 2019-08-04 DIAGNOSIS — R3121 Asymptomatic microscopic hematuria: Secondary | ICD-10-CM | POA: Diagnosis not present

## 2019-08-04 DIAGNOSIS — Z832 Family history of diseases of the blood and blood-forming organs and certain disorders involving the immune mechanism: Secondary | ICD-10-CM | POA: Diagnosis not present

## 2019-08-04 DIAGNOSIS — M722 Plantar fascial fibromatosis: Secondary | ICD-10-CM | POA: Diagnosis not present

## 2019-08-04 DIAGNOSIS — L219 Seborrheic dermatitis, unspecified: Secondary | ICD-10-CM | POA: Diagnosis not present

## 2019-08-04 DIAGNOSIS — E7849 Other hyperlipidemia: Secondary | ICD-10-CM | POA: Diagnosis not present

## 2019-08-04 DIAGNOSIS — R519 Headache, unspecified: Secondary | ICD-10-CM | POA: Diagnosis not present

## 2019-08-04 DIAGNOSIS — Z0000 Encounter for general adult medical examination without abnormal findings: Secondary | ICD-10-CM | POA: Diagnosis not present

## 2019-11-01 ENCOUNTER — Other Ambulatory Visit (HOSPITAL_COMMUNITY): Payer: Self-pay | Admitting: Internal Medicine

## 2019-11-01 MED FILL — ATORVASTATIN CALCIUM 10 MG: 10 | 90 days supply | Qty: 90 | Fill #0

## 2020-02-07 MED FILL — ATORVASTATIN CALCIUM 10 MG: 10 | 90 days supply | Qty: 90 | Fill #1

## 2020-02-29 ENCOUNTER — Other Ambulatory Visit: Payer: Self-pay | Admitting: Obstetrics and Gynecology

## 2020-02-29 DIAGNOSIS — Z1231 Encounter for screening mammogram for malignant neoplasm of breast: Secondary | ICD-10-CM

## 2020-03-20 ENCOUNTER — Other Ambulatory Visit (HOSPITAL_COMMUNITY): Payer: Self-pay | Admitting: Internal Medicine

## 2020-03-20 MED FILL — valACYclovir HCL 1 GM TABS: 1 | 5 days supply | Qty: 20 | Fill #0

## 2020-04-10 ENCOUNTER — Ambulatory Visit
Admission: RE | Admit: 2020-04-10 | Discharge: 2020-04-10 | Disposition: A | Discharge status: Home / Self Care | Payer: 59 | Source: Ambulatory Visit | Attending: Obstetrics and Gynecology | Admitting: Obstetrics and Gynecology

## 2020-04-10 ENCOUNTER — Ambulatory Visit: Payer: 59

## 2020-04-10 ENCOUNTER — Other Ambulatory Visit: Payer: Self-pay

## 2020-04-10 DIAGNOSIS — Z1231 Encounter for screening mammogram for malignant neoplasm of breast: Secondary | ICD-10-CM | POA: Diagnosis not present

## 2020-04-17 DIAGNOSIS — Z01419 Encounter for gynecological examination (general) (routine) without abnormal findings: Secondary | ICD-10-CM | POA: Diagnosis not present

## 2020-04-17 DIAGNOSIS — Z6825 Body mass index (BMI) 25.0-25.9, adult: Secondary | ICD-10-CM | POA: Diagnosis not present

## 2020-04-23 ENCOUNTER — Other Ambulatory Visit (HOSPITAL_COMMUNITY): Payer: Self-pay | Admitting: Internal Medicine

## 2020-04-23 MED FILL — ATORVASTATIN CALCIUM 10 MG: 10 | 90 days supply | Qty: 90 | Fill #0

## 2020-04-30 DIAGNOSIS — L821 Other seborrheic keratosis: Secondary | ICD-10-CM | POA: Diagnosis not present

## 2020-04-30 DIAGNOSIS — L814 Other melanin hyperpigmentation: Secondary | ICD-10-CM | POA: Diagnosis not present

## 2020-04-30 DIAGNOSIS — L309 Dermatitis, unspecified: Secondary | ICD-10-CM | POA: Diagnosis not present

## 2020-04-30 DIAGNOSIS — D1801 Hemangioma of skin and subcutaneous tissue: Secondary | ICD-10-CM | POA: Diagnosis not present

## 2020-05-10 DIAGNOSIS — H2513 Age-related nuclear cataract, bilateral: Secondary | ICD-10-CM | POA: Diagnosis not present

## 2020-05-10 DIAGNOSIS — H5203 Hypermetropia, bilateral: Secondary | ICD-10-CM | POA: Diagnosis not present

## 2020-05-10 DIAGNOSIS — H524 Presbyopia: Secondary | ICD-10-CM | POA: Diagnosis not present

## 2020-07-16 MED FILL — ATORVASTATIN CALCIUM 10 MG: 10 | 90 days supply | Qty: 90 | Fill #1

## 2020-08-07 DIAGNOSIS — M859 Disorder of bone density and structure, unspecified: Secondary | ICD-10-CM | POA: Diagnosis not present

## 2020-08-07 DIAGNOSIS — Z0000 Encounter for general adult medical examination without abnormal findings: Secondary | ICD-10-CM | POA: Diagnosis not present

## 2020-08-07 DIAGNOSIS — Z7689 Persons encountering health services in other specified circumstances: Secondary | ICD-10-CM | POA: Diagnosis not present

## 2020-08-07 DIAGNOSIS — E785 Hyperlipidemia, unspecified: Secondary | ICD-10-CM | POA: Diagnosis not present

## 2020-08-14 DIAGNOSIS — D6852 Prothrombin gene mutation: Secondary | ICD-10-CM | POA: Diagnosis not present

## 2020-08-14 DIAGNOSIS — N318 Other neuromuscular dysfunction of bladder: Secondary | ICD-10-CM | POA: Diagnosis not present

## 2020-08-14 DIAGNOSIS — N182 Chronic kidney disease, stage 2 (mild): Secondary | ICD-10-CM | POA: Diagnosis not present

## 2020-08-14 DIAGNOSIS — R82998 Other abnormal findings in urine: Secondary | ICD-10-CM | POA: Diagnosis not present

## 2020-08-14 DIAGNOSIS — E785 Hyperlipidemia, unspecified: Secondary | ICD-10-CM | POA: Diagnosis not present

## 2020-08-14 DIAGNOSIS — Z23 Encounter for immunization: Secondary | ICD-10-CM | POA: Diagnosis not present

## 2020-08-14 DIAGNOSIS — Z8341 Family history of multiple endocrine neoplasia [MEN] syndrome: Secondary | ICD-10-CM | POA: Diagnosis not present

## 2020-08-14 DIAGNOSIS — Z0000 Encounter for general adult medical examination without abnormal findings: Secondary | ICD-10-CM | POA: Diagnosis not present

## 2020-08-14 DIAGNOSIS — M25552 Pain in left hip: Secondary | ICD-10-CM | POA: Diagnosis not present

## 2020-08-14 DIAGNOSIS — Z1331 Encounter for screening for depression: Secondary | ICD-10-CM | POA: Diagnosis not present

## 2020-08-14 DIAGNOSIS — B009 Herpesviral infection, unspecified: Secondary | ICD-10-CM | POA: Diagnosis not present

## 2020-10-18 ENCOUNTER — Other Ambulatory Visit (HOSPITAL_COMMUNITY): Payer: Self-pay

## 2020-10-18 MED FILL — Atorvastatin Calcium Tab 10 MG (Base Equivalent): ORAL | 90 days supply | Qty: 90 | Fill #0 | Status: AC

## 2021-01-27 ENCOUNTER — Other Ambulatory Visit (HOSPITAL_COMMUNITY): Payer: Self-pay

## 2021-01-27 MED FILL — Atorvastatin Calcium Tab 10 MG (Base Equivalent): ORAL | 90 days supply | Qty: 90 | Fill #1 | Status: AC

## 2021-02-07 ENCOUNTER — Other Ambulatory Visit: Payer: Self-pay | Admitting: Obstetrics and Gynecology

## 2021-02-07 DIAGNOSIS — Z1231 Encounter for screening mammogram for malignant neoplasm of breast: Secondary | ICD-10-CM

## 2021-03-26 ENCOUNTER — Other Ambulatory Visit (HOSPITAL_COMMUNITY): Payer: Self-pay

## 2021-03-26 MED ORDER — VALACYCLOVIR HCL 1 G PO TABS
ORAL_TABLET | ORAL | 7 refills | Status: AC
  Filled 2021-03-26: qty 20, 5d supply, fill #0
  Filled 2021-07-28: qty 20, 5d supply, fill #1
  Filled 2021-10-23: qty 20, 5d supply, fill #2
  Filled 2022-01-22: qty 20, 5d supply, fill #3

## 2021-03-26 MED ORDER — TRIAMCINOLONE ACETONIDE 0.1 % EX CREA
1.0000 "application " | TOPICAL_CREAM | Freq: Three times a day (TID) | CUTANEOUS | 1 refills | Status: AC | PRN
  Filled 2021-03-26: qty 60, 20d supply, fill #0

## 2021-03-27 ENCOUNTER — Other Ambulatory Visit (HOSPITAL_COMMUNITY): Payer: Self-pay

## 2021-04-11 ENCOUNTER — Ambulatory Visit: Payer: 59

## 2021-04-11 ENCOUNTER — Ambulatory Visit
Admission: RE | Admit: 2021-04-11 | Discharge: 2021-04-11 | Disposition: A | Discharge status: Home / Self Care | Payer: 59 | Source: Ambulatory Visit | Attending: Obstetrics and Gynecology | Admitting: Obstetrics and Gynecology

## 2021-04-11 DIAGNOSIS — Z1231 Encounter for screening mammogram for malignant neoplasm of breast: Secondary | ICD-10-CM | POA: Diagnosis not present

## 2021-04-22 DIAGNOSIS — Z01419 Encounter for gynecological examination (general) (routine) without abnormal findings: Secondary | ICD-10-CM | POA: Diagnosis not present

## 2021-04-22 DIAGNOSIS — Z6825 Body mass index (BMI) 25.0-25.9, adult: Secondary | ICD-10-CM | POA: Diagnosis not present

## 2021-04-30 ENCOUNTER — Other Ambulatory Visit (HOSPITAL_COMMUNITY): Payer: Self-pay

## 2021-05-01 ENCOUNTER — Other Ambulatory Visit (HOSPITAL_COMMUNITY): Payer: Self-pay

## 2021-05-01 MED ORDER — ATORVASTATIN CALCIUM 10 MG PO TABS
10.0000 mg | ORAL_TABLET | Freq: Every day | ORAL | 1 refills | Status: DC
  Filled 2021-05-01: qty 90, 90d supply, fill #0
  Filled 2021-07-28: qty 90, 90d supply, fill #1

## 2021-05-02 ENCOUNTER — Other Ambulatory Visit (HOSPITAL_COMMUNITY): Payer: Self-pay

## 2021-05-13 DIAGNOSIS — H524 Presbyopia: Secondary | ICD-10-CM | POA: Diagnosis not present

## 2021-05-13 DIAGNOSIS — H25813 Combined forms of age-related cataract, bilateral: Secondary | ICD-10-CM | POA: Diagnosis not present

## 2021-05-13 DIAGNOSIS — H5203 Hypermetropia, bilateral: Secondary | ICD-10-CM | POA: Diagnosis not present

## 2021-07-28 ENCOUNTER — Other Ambulatory Visit (HOSPITAL_COMMUNITY): Payer: Self-pay

## 2021-08-19 DIAGNOSIS — L218 Other seborrheic dermatitis: Secondary | ICD-10-CM | POA: Diagnosis not present

## 2021-08-19 DIAGNOSIS — D485 Neoplasm of uncertain behavior of skin: Secondary | ICD-10-CM | POA: Diagnosis not present

## 2021-08-19 DIAGNOSIS — L820 Inflamed seborrheic keratosis: Secondary | ICD-10-CM | POA: Diagnosis not present

## 2021-08-19 DIAGNOSIS — D3611 Benign neoplasm of peripheral nerves and autonomic nervous system of face, head, and neck: Secondary | ICD-10-CM | POA: Diagnosis not present

## 2021-08-19 DIAGNOSIS — L821 Other seborrheic keratosis: Secondary | ICD-10-CM | POA: Diagnosis not present

## 2021-08-28 DIAGNOSIS — Z0000 Encounter for general adult medical examination without abnormal findings: Secondary | ICD-10-CM | POA: Diagnosis not present

## 2021-08-28 DIAGNOSIS — Z8341 Family history of multiple endocrine neoplasia [MEN] syndrome: Secondary | ICD-10-CM | POA: Diagnosis not present

## 2021-08-28 DIAGNOSIS — M8589 Other specified disorders of bone density and structure, multiple sites: Secondary | ICD-10-CM | POA: Diagnosis not present

## 2021-08-28 DIAGNOSIS — E785 Hyperlipidemia, unspecified: Secondary | ICD-10-CM | POA: Diagnosis not present

## 2021-08-28 DIAGNOSIS — M859 Disorder of bone density and structure, unspecified: Secondary | ICD-10-CM | POA: Diagnosis not present

## 2021-08-28 DIAGNOSIS — R7989 Other specified abnormal findings of blood chemistry: Secondary | ICD-10-CM | POA: Diagnosis not present

## 2021-09-04 DIAGNOSIS — Z1331 Encounter for screening for depression: Secondary | ICD-10-CM | POA: Diagnosis not present

## 2021-09-04 DIAGNOSIS — R82998 Other abnormal findings in urine: Secondary | ICD-10-CM | POA: Diagnosis not present

## 2021-09-04 DIAGNOSIS — B009 Herpesviral infection, unspecified: Secondary | ICD-10-CM | POA: Diagnosis not present

## 2021-09-04 DIAGNOSIS — Z0000 Encounter for general adult medical examination without abnormal findings: Secondary | ICD-10-CM | POA: Diagnosis not present

## 2021-09-04 DIAGNOSIS — N318 Other neuromuscular dysfunction of bladder: Secondary | ICD-10-CM | POA: Diagnosis not present

## 2021-09-04 DIAGNOSIS — N182 Chronic kidney disease, stage 2 (mild): Secondary | ICD-10-CM | POA: Diagnosis not present

## 2021-09-04 DIAGNOSIS — M8580 Other specified disorders of bone density and structure, unspecified site: Secondary | ICD-10-CM | POA: Diagnosis not present

## 2021-09-04 DIAGNOSIS — D6852 Prothrombin gene mutation: Secondary | ICD-10-CM | POA: Diagnosis not present

## 2021-09-04 DIAGNOSIS — E785 Hyperlipidemia, unspecified: Secondary | ICD-10-CM | POA: Diagnosis not present

## 2021-09-04 DIAGNOSIS — Z23 Encounter for immunization: Secondary | ICD-10-CM | POA: Diagnosis not present

## 2021-09-04 DIAGNOSIS — R3121 Asymptomatic microscopic hematuria: Secondary | ICD-10-CM | POA: Diagnosis not present

## 2021-09-04 DIAGNOSIS — H9319 Tinnitus, unspecified ear: Secondary | ICD-10-CM | POA: Diagnosis not present

## 2021-09-08 ENCOUNTER — Other Ambulatory Visit: Payer: Self-pay | Admitting: Internal Medicine

## 2021-09-08 DIAGNOSIS — E785 Hyperlipidemia, unspecified: Secondary | ICD-10-CM

## 2021-10-08 ENCOUNTER — Other Ambulatory Visit (HOSPITAL_COMMUNITY): Payer: Self-pay

## 2021-10-08 MED ORDER — AMOXICILLIN 500 MG PO CAPS
500.0000 mg | ORAL_CAPSULE | Freq: Four times a day (QID) | ORAL | 0 refills | Status: AC
  Filled 2021-10-08: qty 28, 7d supply, fill #0

## 2021-10-16 ENCOUNTER — Other Ambulatory Visit: Payer: 59

## 2021-10-21 ENCOUNTER — Ambulatory Visit
Admission: RE | Admit: 2021-10-21 | Discharge: 2021-10-21 | Disposition: A | Discharge status: Home / Self Care | Payer: No Typology Code available for payment source | Source: Ambulatory Visit | Attending: Internal Medicine | Admitting: Internal Medicine

## 2021-10-21 DIAGNOSIS — E785 Hyperlipidemia, unspecified: Secondary | ICD-10-CM

## 2021-10-21 DIAGNOSIS — I2510 Atherosclerotic heart disease of native coronary artery without angina pectoris: Secondary | ICD-10-CM | POA: Diagnosis not present

## 2021-10-23 ENCOUNTER — Other Ambulatory Visit (HOSPITAL_COMMUNITY): Payer: Self-pay

## 2021-10-24 ENCOUNTER — Other Ambulatory Visit (HOSPITAL_COMMUNITY): Payer: Self-pay

## 2021-10-24 MED ORDER — ATORVASTATIN CALCIUM 10 MG PO TABS
10.0000 mg | ORAL_TABLET | Freq: Every day | ORAL | 3 refills | Status: DC
  Filled 2021-10-24: qty 90, 90d supply, fill #0
  Filled 2022-01-22: qty 90, 90d supply, fill #1
  Filled 2022-04-01: qty 90, 90d supply, fill #2
  Filled 2022-06-30: qty 90, 90d supply, fill #3

## 2021-10-30 ENCOUNTER — Other Ambulatory Visit (HOSPITAL_COMMUNITY): Payer: Self-pay

## 2021-10-30 MED ORDER — EZETIMIBE 10 MG PO TABS
10.0000 mg | ORAL_TABLET | Freq: Every day | ORAL | 3 refills | Status: DC
  Filled 2021-10-30: qty 90, 90d supply, fill #0
  Filled 2022-01-22: qty 90, 90d supply, fill #1
  Filled 2022-04-01: qty 90, 90d supply, fill #2
  Filled 2022-06-30: qty 90, 90d supply, fill #3

## 2022-01-23 ENCOUNTER — Other Ambulatory Visit (HOSPITAL_COMMUNITY): Payer: Self-pay

## 2022-02-17 ENCOUNTER — Other Ambulatory Visit: Payer: Self-pay | Admitting: Obstetrics and Gynecology

## 2022-02-17 DIAGNOSIS — Z1231 Encounter for screening mammogram for malignant neoplasm of breast: Secondary | ICD-10-CM

## 2022-03-31 ENCOUNTER — Other Ambulatory Visit (HOSPITAL_COMMUNITY): Payer: Self-pay

## 2022-03-31 MED ORDER — PREDNISOLONE ACETATE 1 % OP SUSP
1.0000 [drp] | Freq: Three times a day (TID) | OPHTHALMIC | 1 refills | Status: AC
  Filled 2022-03-31: qty 5, 30d supply, fill #0

## 2022-03-31 MED ORDER — KETOROLAC TROMETHAMINE 0.5 % OP SOLN
1.0000 [drp] | Freq: Three times a day (TID) | OPHTHALMIC | 1 refills | Status: AC
  Filled 2022-03-31: qty 5, 30d supply, fill #0

## 2022-03-31 MED ORDER — OFLOXACIN 0.3 % OP SOLN
1.0000 [drp] | Freq: Three times a day (TID) | OPHTHALMIC | 1 refills | Status: AC
  Filled 2022-03-31: qty 5, 30d supply, fill #0

## 2022-04-01 ENCOUNTER — Other Ambulatory Visit (HOSPITAL_COMMUNITY): Payer: Self-pay

## 2022-04-13 ENCOUNTER — Ambulatory Visit
Admission: RE | Admit: 2022-04-13 | Discharge: 2022-04-13 | Disposition: A | Discharge status: Home / Self Care | Payer: Medicare Other | Source: Ambulatory Visit | Attending: Obstetrics and Gynecology | Admitting: Obstetrics and Gynecology

## 2022-04-13 DIAGNOSIS — Z1231 Encounter for screening mammogram for malignant neoplasm of breast: Secondary | ICD-10-CM

## 2022-09-24 ENCOUNTER — Other Ambulatory Visit (HOSPITAL_COMMUNITY): Payer: Self-pay

## 2022-09-25 ENCOUNTER — Other Ambulatory Visit (HOSPITAL_COMMUNITY): Payer: Self-pay

## 2022-09-25 MED ORDER — ATORVASTATIN CALCIUM 10 MG PO TABS
10.0000 mg | ORAL_TABLET | Freq: Every day | ORAL | 3 refills | Status: DC
  Filled 2022-09-25: qty 90, 90d supply, fill #0
  Filled 2022-12-24: qty 90, 90d supply, fill #1
  Filled 2023-03-05: qty 90, 90d supply, fill #2
  Filled 2023-06-28: qty 90, 90d supply, fill #3

## 2022-09-25 MED ORDER — EZETIMIBE 10 MG PO TABS
10.0000 mg | ORAL_TABLET | Freq: Every day | ORAL | 3 refills | Status: DC
  Filled 2022-09-25: qty 90, 90d supply, fill #0
  Filled 2022-12-24: qty 90, 90d supply, fill #1
  Filled 2023-03-05: qty 90, 90d supply, fill #2
  Filled 2023-06-28: qty 90, 90d supply, fill #3

## 2022-11-17 ENCOUNTER — Other Ambulatory Visit: Payer: Self-pay | Admitting: Oncology

## 2022-11-17 DIAGNOSIS — Z006 Encounter for examination for normal comparison and control in clinical research program: Secondary | ICD-10-CM

## 2022-11-25 ENCOUNTER — Other Ambulatory Visit (HOSPITAL_COMMUNITY)
Admission: RE | Admit: 2022-11-25 | Discharge: 2022-11-25 | Disposition: A | Discharge status: Home / Self Care | Payer: Medicare Other | Source: Other Acute Inpatient Hospital | Attending: Oncology | Admitting: Oncology

## 2022-11-25 DIAGNOSIS — Z006 Encounter for examination for normal comparison and control in clinical research program: Secondary | ICD-10-CM

## 2023-01-27 ENCOUNTER — Other Ambulatory Visit (HOSPITAL_COMMUNITY): Payer: Self-pay

## 2023-01-27 MED ORDER — CHLORHEXIDINE GLUCONATE 0.12 % MT SOLN
15.0000 mL | Freq: Two times a day (BID) | OROMUCOSAL | 4 refills | Status: AC
  Filled 2023-01-27: qty 473, 16d supply, fill #0

## 2023-01-27 MED ORDER — NYSTATIN-TRIAMCINOLONE 100000-0.1 UNIT/GM-% EX OINT
1.0000 | TOPICAL_OINTMENT | Freq: Three times a day (TID) | CUTANEOUS | 3 refills | Status: AC
  Filled 2023-01-27: qty 15, 5d supply, fill #0

## 2023-02-01 ENCOUNTER — Other Ambulatory Visit (HOSPITAL_COMMUNITY): Payer: Self-pay

## 2023-02-17 ENCOUNTER — Other Ambulatory Visit (HOSPITAL_COMMUNITY): Payer: Self-pay

## 2023-02-17 ENCOUNTER — Other Ambulatory Visit: Payer: No Typology Code available for payment source

## 2023-02-17 MED ORDER — COVID-19 MRNA VAC-TRIS(PFIZER) 30 MCG/0.3ML IM SUSY
0.3000 mL | PREFILLED_SYRINGE | Freq: Once | INTRAMUSCULAR | 0 refills | Status: AC
  Filled 2023-02-17: qty 0.3, 1d supply, fill #0

## 2023-02-17 MED ORDER — RSVPREF3 VAC RECOMB ADJUVANTED 120 MCG/0.5ML IM SUSR
0.5000 mL | Freq: Once | INTRAMUSCULAR | 0 refills | Status: AC
  Filled 2023-02-17: qty 0.5, 1d supply, fill #0

## 2023-02-23 ENCOUNTER — Inpatient Hospital Stay: Payer: Medicare Other

## 2023-02-23 ENCOUNTER — Other Ambulatory Visit: Payer: Medicare Other

## 2023-03-01 ENCOUNTER — Other Ambulatory Visit: Payer: Self-pay | Admitting: Obstetrics and Gynecology

## 2023-03-01 DIAGNOSIS — Z1231 Encounter for screening mammogram for malignant neoplasm of breast: Secondary | ICD-10-CM

## 2023-03-09 ENCOUNTER — Other Ambulatory Visit: Payer: Self-pay | Admitting: Genetic Counselor

## 2023-03-09 ENCOUNTER — Inpatient Hospital Stay: Payer: Medicare Other | Attending: Internal Medicine | Admitting: Genetic Counselor

## 2023-03-09 ENCOUNTER — Inpatient Hospital Stay: Payer: Medicare Other

## 2023-03-09 DIAGNOSIS — Z8341 Family history of multiple endocrine neoplasia [MEN] syndrome: Secondary | ICD-10-CM | POA: Diagnosis not present

## 2023-03-09 DIAGNOSIS — Z803 Family history of malignant neoplasm of breast: Secondary | ICD-10-CM

## 2023-03-09 LAB — GENETIC SCREENING ORDER

## 2023-03-10 ENCOUNTER — Encounter: Payer: Self-pay | Admitting: Genetic Counselor

## 2023-03-10 DIAGNOSIS — Z8341 Family history of multiple endocrine neoplasia [MEN] syndrome: Secondary | ICD-10-CM | POA: Insufficient documentation

## 2023-03-10 DIAGNOSIS — Z803 Family history of malignant neoplasm of breast: Secondary | ICD-10-CM | POA: Insufficient documentation

## 2023-03-10 NOTE — Progress Notes (Addendum)
REFERRING PROVIDER: Rodrigo Ran, MD [address] [place],  [place] [address]  PRIMARY PROVIDER:  Rodrigo Ran, MD  PRIMARY REASON FOR VISIT:  1. Family history of multiple endocrine neoplasia type 1 (MEN1)   2. Family history of breast cancer      HISTORY OF PRESENT ILLNESS:   Lori Ruiz, a 72 y.o. female, was seen for a [facility] cancer genetics consultation at the request of Dr. Waynard Edwards due to a family history of MEN1 and breast cancer.  Lori Ruiz presents to clinic today to discuss the possibility of a hereditary predisposition to cancer, genetic testing, and to further clarify her future cancer risks, as well as potential cancer risks for family members.  Lori Ruiz is a 72 y.o. female with no personal history of cancer.    CANCER HISTORY:  Oncology History   No history exists.     RISK FACTORS:  Menarche was at age 89.  First live birth at age 56.  OCP use for approximately 0 years.  Ovaries intact: no.  Hysterectomy: yes.  Menopausal status: postmenopausal.  HRT use:  20  years. Colonoscopy: yes; normal. Mammogram within the last year: yes. Number of breast biopsies: 0. Up to date with pelvic exams: n/a. Any excessive radiation exposure in the past: no  Past Medical History:  Diagnosis Date   Family history of breast cancer    Family history of multiple endocrine neoplasia type 1 (MEN1)     Past Surgical History:  Procedure Laterality Date   ABDOMINAL HYSTERECTOMY     ABDOMINAL SURGERY     APPENDECTOMY     TONSILLECTOMY      Social History   Socioeconomic History   Marital status: Divorced    Spouse name: Not on file   Number of children: Not on file   Years of education: Not on file   Highest education level: Not on file  Occupational History   Not on file  Tobacco Use   Smoking status: Never   Smokeless tobacco: Never  Substance and Sexual Activity   Alcohol use: No   Drug use: No   Sexual activity: Not on file  Other Topics Concern   Not  on file  Social History Narrative   Not on file   Social Determinants of Health   Financial Resource Strain: Not on file  Food Insecurity: Not on file  Transportation Needs: Not on file  Physical Activity: Not on file  Stress: Not on file  Social Connections: Not on file     FAMILY HISTORY:  We obtained a detailed, 4-generation family history.  Significant diagnoses are listed below: Family History  Problem Relation Age of Onset   Multiple endocrine neoplasia Mother        type 1   Dementia Father    Multiple endocrine neoplasia Sister    Multiple endocrine neoplasia Sister    Multiple endocrine neoplasia Maternal Grandfather    Breast cancer Neg Hx      The patient has two sons and a daughter.  Her daughter has prothrombin and her son has prothrombin and FVL. One son died of lung cancer.  She has two sisters and two brothers.  Both sisters have tested positive for MEN1.    The patient's mother had MEN1 and melanoma.  She died of blood clots mostly like due to prothrombin.  She did not have siblings. Her father had MEN1 and had Zollinger-Ellison syndrome.  The patient's father died at 72.  He had 11  brothers and three sisters.  One brother had three daughters who all had breast cancer.  Lori Ruiz is aware of previous family history of genetic testing for hereditary cancer risks. There is no reported Ashkenazi Jewish ancestry. There is no known consanguinity.  GENETIC COUNSELING ASSESSMENT: Lori Ruiz is a 72 y.o. female with a family history of MEN1.  She is at 50% risk for this condition. We, therefore, discussed and recommended the following at today's visit.   DISCUSSION: We discussed that, in general, most cancer is not inherited in families, but instead is sporadic or familial. Sporadic cancers occur by chance and typically happen at older ages (>50 years) as this type of cancer is caused by genetic changes acquired during an individual's lifetime. Some families have more  cancers than would be expected by chance; however, the ages or types of cancer are not consistent with a known genetic mutation or known genetic mutations have been ruled out. This type of familial cancer is thought to be due to a combination of multiple genetic, environmental, hormonal, and lifestyle factors. While this combination of factors likely increases the risk of cancer, the exact source of this risk is not currently identifiable or testable.  MEN1 is characterized by parathyroid tumors, pituitary tumors, endocrine tumors of the gastroenteropancreatic tract, carcinoid tumors and adrenocortical tumors.  We discussed that many of these tumors are not cancerous, but since they occur in glands that secrete hormones, there are many issues that arise.  Ms. Stufflebeam denies any of the typical tumors associated with MEN1, but wants to undergo testing to confirm that she does not need additional screening, as her sisters have had many issues with MEN1.  We reviewed the characteristics, features and inheritance patterns of hereditary cancer syndromes. We also discussed genetic testing, including the appropriate family members to test, the process of testing, insurance coverage and turn-around-time for results. We discussed the implications of a negative, positive, carrier and/or variant of uncertain significant result. Lori Ruiz  was offered a common hereditary cancer panel (40+ genes) and an expanded pan-cancer panel (70+ genes). Lori Ruiz was informed of the benefits and limitations of each panel, including that expanded pan-cancer panels contain genes that do not have clear management guidelines at this point in time.  We also discussed that as the number of genes included on a panel increases, the chances of variants of uncertain significance increases. Lori Ruiz decided to pursue genetic testing for the MEN1 gene, reflexing to the CancerNext-RNAinsight gene panel.   The CancerNext gene panel + MEN1 offered by  [facility] includes sequencing and rearrangement analysis for the following 37 genes: APC, ATM, AXIN2, BAP1, BARD1, BMPR1A, BRCA1, BRCA2, BRIP1, CDH1, CDKN2A, CHEK2, EPCAM, FH, FLCN, GREM1, HOXB13, MBD4, MEN1, MET, MLH1, MSH2, MSH3, MSH6, MUTYH, NF1, NTHL1, PALB2, PMS2, POLD1, POLE, PTEN, RAD51C, RAD51D, SMAD4, STK11, TP53, TSC1, TSC2, and VHL. RNA data is routinely analyzed for use in variant interpretation for all genes.    Based on Ms. Hanrahan's family history of MEN1, she meets NCCN 2.2024 Neuroendocrine and Adrenal Tumor guideline criteria for genetic testing. Despite that she meets criteria, she may still have an out of pocket cost. We discussed that if her out of pocket cost for testing is over $100, the laboratory will call and confirm whether she wants to proceed with testing.  If the out of pocket cost of testing is less than $100 she will be billed by the genetic testing laboratory.   We discussed that some people do  not want to undergo genetic testing due to fear of genetic discrimination.  The Genetic Information Nondiscrimination Act (GINA) was signed into federal law in 2008. GINA prohibits health insurers and most employers from discriminating against individuals based on genetic information (including the results of genetic tests and family history information). According to GINA, health insurance companies cannot consider genetic information to be a preexisting condition, nor can they use it to make decisions regarding coverage or rates. GINA also makes it illegal for most employers to use genetic information in making decisions about hiring, firing, promotion, or terms of employment. It is important to note that GINA does not offer protections for life insurance, disability insurance, or long-term care insurance. GINA does not apply to those in the [employer], those who work for companies with less than 15 employees, and new life insurance or long-term disability insurance policies.  Health  status due to a cancer diagnosis is not protected under GINA. More information about GINA can be found by visiting [email or web address].  PLAN: After considering the risks, benefits, and limitations, Ms. Bensing provided informed consent to pursue genetic testing and the blood sample was sent to [facility] for analysis of the CancerNext-Expanded+RNAinsight + MEN1. Results should be available within approximately 2-3 weeks' time, at which point they will be disclosed by telephone to Ms. Koman, as will any additional recommendations warranted by these results. Ms. Memory will receive a summary of her genetic counseling visit and a copy of her results once available. This information will also be available in Epic.   Lastly, we encouraged Ms. Shepherd to remain in contact with cancer genetics annually so that we can continuously update the family history and inform her of any changes in cancer genetics and testing that may be of benefit for this family.   Ms. Rindal questions were answered to her satisfaction today. Our contact information was provided should additional questions or concerns arise. Thank you for the referral and allowing [place] to share in the care of your patient.   Kennis Buell P. Lowell Guitar, MS, [facility] Licensed, Patent attorney [email or web address] phone: [phone]  The patient was seen for a total of 40 minutes in face-to-face genetic counseling.  The patient was seen alone.  Drs. Meliton Rattan, and/or [place] were available for questions, if needed..    _______________________________________________________________________ For Office Staff:  Number of people involved in session: 1 Was an Intern/ student involved with case: no

## 2023-04-05 ENCOUNTER — Encounter: Payer: Self-pay | Admitting: Genetic Counselor

## 2023-04-05 ENCOUNTER — Telehealth: Payer: Self-pay | Admitting: Genetic Counselor

## 2023-04-05 DIAGNOSIS — Z1379 Encounter for other screening for genetic and chromosomal anomalies: Secondary | ICD-10-CM | POA: Insufficient documentation

## 2023-04-05 NOTE — Telephone Encounter (Signed)
LM on VM that results are back and to please call.  Left CB instructions. 

## 2023-04-07 NOTE — Telephone Encounter (Signed)
Revealed negative genetic testing for the familial MEN1 mutation.  Discussed that her children were not at risk for MEN1.

## 2023-04-07 NOTE — Telephone Encounter (Signed)
Left second message that results are back and to please call. Left CB instructions.

## 2023-04-08 ENCOUNTER — Ambulatory Visit: Payer: Self-pay | Admitting: Genetic Counselor

## 2023-04-08 DIAGNOSIS — Z1379 Encounter for other screening for genetic and chromosomal anomalies: Secondary | ICD-10-CM

## 2023-04-08 NOTE — Progress Notes (Signed)
HPI:  Ms. Acomb was previously seen in the [facility] due to a family history of and MEN1 pathogenic variant and concerns regarding a hereditary predisposition to cancer. Please refer to our prior cancer genetics clinic note for more information regarding our discussion, assessment and recommendations, at the time. Ms. Dykstra recent genetic test results were disclosed to her, as were recommendations warranted by these results. These results and recommendations are discussed in more detail below.  CANCER HISTORY:  Oncology History   No history exists.    FAMILY HISTORY:  We obtained a detailed, 4-generation family history.  Significant diagnoses are listed below: Family History  Problem Relation Age of Onset   Multiple endocrine neoplasia Mother        type 1   Dementia Father    Multiple endocrine neoplasia Sister    Multiple endocrine neoplasia Sister    Multiple endocrine neoplasia Maternal Grandfather    Breast cancer Neg Hx        The patient has two sons and a daughter.  Her daughter has prothrombin and her son has prothrombin and FVL. One son died of lung cancer.  She has two sisters and two brothers.  Both sisters have tested positive for MEN1.     The patient's mother had MEN1 and melanoma.  She died of blood clots mostly like due to prothrombin.  She did not have siblings. Her father had MEN1 and had Zollinger-Ellison syndrome.   The patient's father died at 25.  He had 11 brothers and three sisters.  One brother had three daughters who all had breast cancer.   Ms. Snipes is aware of previous family history of genetic testing for hereditary cancer risks. There is no reported Ashkenazi Jewish ancestry. There is no known consanguinity  GENETIC TEST RESULTS: We recommended Ms. 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 Ms. Demint test was normal and did not reveal the familial mutation. We call this  result a true negative result because the disease-causing mutation was identified in Ms. Osten's family, and she did not inherit it.  Given this negative result, Ms. Radich chances of developing MEN1-related tumors are the same as they are in the general population.  Genetic testing reported out on April 03, 2023 through the CancerNext-MEN+RNA cancer panel found no pathogenic mutations. The [facility] CancerNext+RNAinsight Panel includes sequencing, rearrangement analysis, and RNA analysis for the following 40 genes: APC, ATM, BAP1, BARD1, BMPR1A, BRCA1, BRCA2, BRIP1, CDH1, CDKN2A, CHEK2, FH, FLCN, MEN1, MET, MLH1, MSH2, MSH6, MUTYH, NF1, NTHL1, PALB2, PMS2, PTEN, RAD51C, RAD51D, SMAD4, STK11, TP53, TSC1, TSC2, and VHL (sequencing and deletion/duplication); AXIN2, HOXB13, MBD4, MSH3, POLD1 and POLE (sequencing only); EPCAM and GREM1 (deletion/duplication only). The test report has been scanned into EPIC and is located under the Molecular Pathology section of the Results Review tab.  A portion of the result report is included below for reference.     ADDITIONAL GENETIC TESTING: We discussed with Ms. Candanoza that there are other genes that are associated with increased cancer risk that can be analyzed. Should Ms. Ocasio wish to pursue additional genetic testing, we are happy to discuss and coordinate this testing, at any time.    CANCER SCREENING RECOMMENDATIONS: Ms. Kirchmann test result is considered negative (normal).  Therefore, it is recommended she continue to follow the cancer management and screening guidelines provided by her primary healthcare provider. An individual's cancer risk and medical management are not determined by genetic  test results alone. Overall cancer risk assessment incorporates additional factors, including personal medical history, family history, and any available genetic information that may result in a personalized plan for cancer prevention and surveillance  RECOMMENDATIONS FOR FAMILY  MEMBERS:  Individuals in this family might be at some increased risk of developing cancer, over the general population risk, simply due to the family history of cancer.  We recommended women in this family have a yearly mammogram beginning at age 70, or 23 years younger than the earliest onset of cancer, an annual clinical breast exam, and perform monthly breast self-exams. Women in this family should also have a gynecological exam as recommended by their primary provider. All family members should be referred for colonoscopy starting at age 72, or 30 years younger than the earliest onset of cancer.  FOLLOW-UP: Lastly, we discussed with Ms. Bernabei that cancer genetics is a rapidly advancing field and it is possible that new genetic tests will be appropriate for her and/or her family members in the future. We encouraged her to remain in contact with cancer genetics on an annual basis so we can update her personal and family histories and let her know of advances in cancer genetics that may benefit this family.   Our contact number was provided. Ms. Dusel questions were answered to her satisfaction, and she knows she is welcome to call us at anytime with additional questions or concerns.   Maylon Cos, MS, [facility] Licensed, Certified Genetic Counselor [email or web address]

## 2023-04-15 ENCOUNTER — Ambulatory Visit
Admission: RE | Admit: 2023-04-15 | Discharge: 2023-04-15 | Disposition: A | Discharge status: Home / Self Care | Payer: Medicare Other | Source: Ambulatory Visit | Attending: Obstetrics and Gynecology | Admitting: Obstetrics and Gynecology

## 2023-04-15 DIAGNOSIS — Z1231 Encounter for screening mammogram for malignant neoplasm of breast: Secondary | ICD-10-CM

## 2023-04-29 ENCOUNTER — Other Ambulatory Visit (HOSPITAL_COMMUNITY): Payer: Self-pay

## 2023-04-29 MED ORDER — VALACYCLOVIR HCL 1 G PO TABS
2000.0000 mg | ORAL_TABLET | Freq: Two times a day (BID) | ORAL | 7 refills | Status: AC
  Filled 2023-04-29: qty 20, 5d supply, fill #0
  Filled 2024-02-10: qty 20, 5d supply, fill #1

## 2023-04-30 ENCOUNTER — Other Ambulatory Visit (HOSPITAL_COMMUNITY): Payer: Self-pay

## 2023-06-25 ENCOUNTER — Other Ambulatory Visit (HOSPITAL_COMMUNITY): Payer: Self-pay

## 2023-06-25 MED ORDER — DICLOFENAC SODIUM 50 MG PO TBEC
50.0000 mg | DELAYED_RELEASE_TABLET | Freq: Every day | ORAL | 3 refills | Status: AC | PRN
  Filled 2023-06-25: qty 30, 30d supply, fill #0

## 2023-06-29 ENCOUNTER — Other Ambulatory Visit (HOSPITAL_COMMUNITY): Payer: Self-pay

## 2023-06-30 ENCOUNTER — Other Ambulatory Visit (HOSPITAL_COMMUNITY): Payer: Self-pay

## 2023-06-30 ENCOUNTER — Encounter (HOSPITAL_COMMUNITY): Payer: Self-pay

## 2023-07-28 ENCOUNTER — Other Ambulatory Visit (HOSPITAL_COMMUNITY): Payer: Self-pay

## 2023-07-28 MED ORDER — CHLORHEXIDINE GLUCONATE 0.12 % MT SOLN
15.0000 mL | Freq: Two times a day (BID) | OROMUCOSAL | 2 refills | Status: AC
  Filled 2023-07-28: qty 473, 16d supply, fill #0

## 2023-07-28 MED ORDER — NYSTATIN-TRIAMCINOLONE 100000-0.1 UNIT/GM-% EX OINT
1.0000 | TOPICAL_OINTMENT | Freq: Three times a day (TID) | CUTANEOUS | 4 refills | Status: AC
  Filled 2023-07-28: qty 15, 5d supply, fill #0

## 2023-08-16 ENCOUNTER — Other Ambulatory Visit (HOSPITAL_COMMUNITY): Payer: Self-pay

## 2023-08-16 ENCOUNTER — Other Ambulatory Visit (HOSPITAL_COMMUNITY): Payer: Self-pay | Admitting: Optometry

## 2023-08-16 DIAGNOSIS — H532 Diplopia: Secondary | ICD-10-CM

## 2023-08-16 MED ORDER — EZETIMIBE 10 MG PO TABS
10.0000 mg | ORAL_TABLET | Freq: Every day | ORAL | 3 refills | Status: AC
  Filled 2023-08-16 – 2023-09-22 (×3): qty 90, 90d supply, fill #0
  Filled 2023-12-24: qty 90, 90d supply, fill #1
  Filled 2024-03-10: qty 90, 90d supply, fill #2

## 2023-08-16 MED ORDER — ATORVASTATIN CALCIUM 10 MG PO TABS
10.0000 mg | ORAL_TABLET | Freq: Every day | ORAL | 3 refills | Status: AC
  Filled 2023-08-16 – 2023-09-22 (×2): qty 90, 90d supply, fill #0
  Filled 2023-12-24: qty 90, 90d supply, fill #1
  Filled 2024-03-10: qty 90, 90d supply, fill #2

## 2023-08-20 ENCOUNTER — Ambulatory Visit (HOSPITAL_COMMUNITY)
Admission: RE | Admit: 2023-08-20 | Discharge: 2023-08-20 | Disposition: A | Discharge status: Home / Self Care | Source: Ambulatory Visit | Attending: Optometry | Admitting: Optometry

## 2023-08-20 DIAGNOSIS — H532 Diplopia: Secondary | ICD-10-CM | POA: Diagnosis present

## 2023-08-20 MED ORDER — GADOBUTROL 1 MMOL/ML IV SOLN
7.0000 mL | Freq: Once | INTRAVENOUS | Status: AC | PRN
  Administered 2023-08-20: 7 mL via INTRAVENOUS

## 2023-08-21 ENCOUNTER — Ambulatory Visit (HOSPITAL_COMMUNITY)

## 2023-08-30 ENCOUNTER — Other Ambulatory Visit (HOSPITAL_COMMUNITY): Payer: Self-pay

## 2023-09-23 ENCOUNTER — Other Ambulatory Visit (HOSPITAL_COMMUNITY): Payer: Self-pay

## 2024-01-21 ENCOUNTER — Other Ambulatory Visit (HOSPITAL_COMMUNITY): Payer: Self-pay

## 2024-01-21 MED ORDER — COVID-19 MRNA VAC-TRIS(PFIZER) 30 MCG/0.3ML IM SUSY
0.3000 mL | PREFILLED_SYRINGE | Freq: Once | INTRAMUSCULAR | 0 refills | Status: AC
Start: 2024-01-21 — End: 2024-01-22
  Filled 2024-01-21: qty 0.3, 1d supply, fill #0

## 2024-02-02 ENCOUNTER — Other Ambulatory Visit (HOSPITAL_COMMUNITY): Payer: Self-pay

## 2024-02-02 MED ORDER — NYSTATIN-TRIAMCINOLONE 100000-0.1 UNIT/GM-% EX OINT
1.0000 | TOPICAL_OINTMENT | Freq: Three times a day (TID) | CUTANEOUS | 2 refills | Status: AC
  Filled 2024-02-02 – 2024-02-04 (×2): qty 15, 5d supply, fill #0

## 2024-02-02 MED ORDER — CHLORHEXIDINE GLUCONATE 0.12 % MT SOLN
15.0000 mL | Freq: Two times a day (BID) | OROMUCOSAL | 1 refills | Status: AC
Start: 2024-02-02 — End: ?
  Filled 2024-02-02: qty 473, 16d supply, fill #0

## 2024-02-03 ENCOUNTER — Other Ambulatory Visit (HOSPITAL_COMMUNITY): Payer: Self-pay

## 2024-02-04 ENCOUNTER — Other Ambulatory Visit (HOSPITAL_COMMUNITY): Payer: Self-pay

## 2024-03-06 ENCOUNTER — Other Ambulatory Visit: Payer: Self-pay | Admitting: Obstetrics and Gynecology

## 2024-03-06 DIAGNOSIS — Z1231 Encounter for screening mammogram for malignant neoplasm of breast: Secondary | ICD-10-CM

## 2024-03-27 ENCOUNTER — Other Ambulatory Visit (HOSPITAL_COMMUNITY): Payer: Self-pay

## 2024-03-27 MED ORDER — PEG 3350-KCL-NABCB-NACL-NASULF 236 G PO SOLR
ORAL | 0 refills | Status: AC
  Filled 2024-03-27: qty 4000, 1d supply, fill #0

## 2024-04-18 ENCOUNTER — Ambulatory Visit
Admission: RE | Admit: 2024-04-18 | Discharge: 2024-04-18 | Disposition: A | Discharge status: Home / Self Care | Source: Ambulatory Visit | Attending: Obstetrics and Gynecology | Admitting: Obstetrics and Gynecology

## 2024-04-18 DIAGNOSIS — Z1231 Encounter for screening mammogram for malignant neoplasm of breast: Secondary | ICD-10-CM
